# Patient Record
Sex: Female | Born: 1970 | Race: White | Hispanic: No | Marital: Married | Smoking: Never smoker
Health system: Southern US, Community
[De-identification: ages and names within clinical notes are randomized; demographics above are authoritative.]

## PROBLEM LIST (undated history)

## (undated) DIAGNOSIS — K8020 Calculus of gallbladder without cholecystitis without obstruction: Secondary | ICD-10-CM

## (undated) DIAGNOSIS — R002 Palpitations: Secondary | ICD-10-CM

## (undated) DIAGNOSIS — N83209 Unspecified ovarian cyst, unspecified side: Secondary | ICD-10-CM

## (undated) HISTORY — PX: WISDOM TOOTH EXTRACTION: SHX21

## (undated) HISTORY — DX: Calculus of gallbladder without cholecystitis without obstruction: K80.20

## (undated) HISTORY — DX: Palpitations: R00.2

## (undated) HISTORY — PX: EYE SURGERY: SHX253

---

## 2000-01-07 ENCOUNTER — Other Ambulatory Visit: Admission: RE | Admit: 2000-01-07 | Discharge: 2000-01-07 | Payer: Self-pay | Admitting: Obstetrics and Gynecology

## 2003-03-20 ENCOUNTER — Ambulatory Visit (HOSPITAL_COMMUNITY): Admission: RE | Admit: 2003-03-20 | Discharge: 2003-03-20 | Payer: Self-pay

## 2003-07-31 ENCOUNTER — Encounter (INDEPENDENT_AMBULATORY_CARE_PROVIDER_SITE_OTHER): Payer: Self-pay | Admitting: *Deleted

## 2003-07-31 ENCOUNTER — Inpatient Hospital Stay (HOSPITAL_COMMUNITY): Admission: AD | Admit: 2003-07-31 | Discharge: 2003-08-04 | Payer: Self-pay

## 2004-11-18 ENCOUNTER — Other Ambulatory Visit: Admission: RE | Admit: 2004-11-18 | Discharge: 2004-11-18 | Payer: Self-pay | Admitting: Obstetrics and Gynecology

## 2006-12-26 ENCOUNTER — Inpatient Hospital Stay (HOSPITAL_COMMUNITY): Admission: RE | Admit: 2006-12-26 | Discharge: 2006-12-29 | Payer: Self-pay | Admitting: Obstetrics and Gynecology

## 2008-12-19 HISTORY — PX: ABDOMINOPLASTY: SHX5355

## 2008-12-19 HISTORY — PX: BREAST ENHANCEMENT SURGERY: SHX7

## 2009-01-14 ENCOUNTER — Encounter: Admission: RE | Admit: 2009-01-14 | Discharge: 2009-01-14 | Payer: Self-pay | Admitting: Obstetrics and Gynecology

## 2011-05-06 NOTE — Discharge Summary (Signed)
NAME:  [name] [name], [name] [name]                          ACCOUNT NO.:  [id number]   MEDICAL RECORD NO.:  [id number]                   PATIENT TYPE:  INP   LOCATION:  9127                                 [facility]ILITY:  [facility]   PHYSICIAN:  Ronda Fairly. Galen Daft, M.D.              DATE OF BIRTH:  09/22/1971   DATE OF ADMISSION:  07/31/2003  DATE OF DISCHARGE:  08/04/2003                                 DISCHARGE SUMMARY   ADMISSION DIAGNOSIS:  HELLP syndrome, severe pre-eclampsia.   PRINCIPAL DIAGNOSIS:  HELLP syndrome, severe pre-eclampsia.   SECONDARY DIAGNOSES:  1. Term pregnancy remote from delivery.  2. Primigravida.  3. Nasal hemorrhage.   COMPLICATIONS OF HOSPITALIZATION:  None.   CONDITION AT DISCHARGE:  Improved.   PRINCIPAL PROCEDURE:  Primary low-transverse cesarean section.   SECONDARY PROCEDURE:  Magnesium prophylaxis.   FINDINGS/HOSPITAL COURSE:  The patient was admitted on the 12th with severe  pre-eclampsia and HELLP syndrome.  She had previously been well without  complaints, except for some chest pain the week before she stated.  The  patient was [redacted] weeks pregnant.  The cervix was long and closed and it was  noted that her platelets were abnormally decreased and her liver function  tests were elevated.  The patient had 3/3 reflexes.  Her blood pressure was  158/106 and otherwise she was asymptomatic.  There was proteinuria of  greater than 300 mg on the urine dip stick.  The patient had no shortness of  breath, right upper quadrant pain or visual changes.  She had no edema, no  vaginal bleeding.   The initial laboratory values showed the patient's hemoglobin to be 13.9.  Her platelets were 97,000.  Her sodium, potassium chloride and carbon  dioxide all normal.  Creatinine was 1.1 and her liver function tests showed  an elevated AST at 46 and ALT at 51.  Alkaline phosphatase was 296.  Uric  acid 9.7.  She was started on magnesium therapy immediately and the decision  for  cesarean section was based on the fact that this was an anticipated  large infant, remote to term with a closed cervix and severe pre-eclampsia  with HELLP syndrome.   The patient agreed to cesarean type delivery.  This was carried out on that  very same day under spinal anesthesia and magnesium sulfate.  It was a baby  boy, weight was 8 pounds 9 ounces and the Apgar scores were 8 and 9.  The  surgery was unremarkable.  The uterus was closed.  It was a low cervical  transverse incision and there was small erosion on the posterior wall of the  uterus.  A biopsy was performed, ultimately came back as inflammation and  adhesion otherwise unremarkable.  No definitive endometriosis.   Mom was doing well in the postoperative period.  She had normal return of  bowel function.  But she did have worsening of  her laboratory values.  She  was in the intensive care unit and maintained on magnesium during this  course.  Consideration was made for administration of steroids however, I  reviewed the literature on steroid administration for HELLP syndrome and  found that while there was statistical improvement in laboratory values,  there was no difference in outcome in the groups studied in the one  particular study.  There were approximately 30 patients in this __________  and parameters were improved __________ with the treated patients versus the  placebo patients, as far as the platelet numbers and the liver function  test, but again there was no difference in morbidity or mortality.  This was  discussed with the patient and steroids were not utilized.  The patient did  improve clinically.  She did have some nose bleeding at one point which  resolved spontaneously.  Her platelet count went from 97,000 on the day of  admission to 47,000 at it's lowest point.  Her hemoglobin dropped down to  10.2 grams and that was on the evening of the 13th.  On the 14th, the  improvement was to 11.6 and the platelet  count was 67,000.  On the 15th, it  improved further with a platelet count at 109,000.  Liver function tests  showed a similar pattern, worsening on the 13th with the highest levels on  the evening of the 13th at 102 for the AST and 59 for the ALT.  The LVH also  elevated at 870.  Uric acid at 9.0.  These values were improving on the next  day.  On the 14th, they were 67 and 47 for AST and ALT respectively.  On the  15th, the following values were AST 37, ALT 32, both of these are in normal  range and the alkaline phosphatase slightly elevated at 205.  LDH was also  improving at 415 and the uric acid had diminished substantially to 6.4.  All  of these parameters from a laboratory standpoint showed remarkable  improvement.  Her blood type was B positive.  She had a nonreactive syphilis  test.  The final hemoglobin value was 10.1 on the 15th.   The patient was discharged home with activity limits, wound instructions,  follow up in the office within one week and medication of Percocet and  vitamins on August 04, 2003.   She named her baby boy Chrissie Noa.  The baby had circumcision done without  difficulty.   The patient had normal return of bowel function.  A normalized physical  examination on the day of discharge.  Activity limits, wound care, followup  in the office and medication were all addressed.                                               Ronda Fairly. Galen Daft, M.D.    NJT/MEDQ  D:  08/27/2003  T:  08/27/2003  Job:  [id number]

## 2011-05-06 NOTE — Op Note (Signed)
NAME:  [place], Verneta                ACCOUNT NO.:  [id number]   MEDICAL RECORD NO.:  [id number]          PATIENT TYPE:  INP   LOCATION:  9124                          [facility]   PHYSICIAN:  Lenoard Aden, M.D.DATE OF BIRTH:  [birth year]   DATE OF PROCEDURE:  12/26/2006  DATE OF DISCHARGE:                               OPERATIVE REPORT   PREOPERATIVE DIAGNOSES:  1. A 37-week intrauterine pregnancy.  2. Two previous cesarean sections.  3. Presumed macrosomia.   POSTOPERATIVE DIAGNOSES:  1. A 37-week intrauterine pregnancy.  2. Two previous cesarean sections.  3. Presumed macrosomia.  4. Lower uterine segment window.   PROCEDURE:  Repeat low segment transverse cesarean section.   SURGEON:  Lenoard Aden, M.D.   ASSISTANT:  Maxie Better, M.D.   ANESTHESIA:  Spinal by Octaviano Glow. Pamalee Leyden, M.D.   ESTIMATED BLOOD LOSS:  Was 1000 mL.   COMPLICATIONS:  None.   DRAINS:  Foley catheter.   COUNTS:  Correct.   DISPOSITION:  The patient to the recovery room in good condition.  The  placenta to __________ .   FINDINGS:  Normal tubes, normal ovaries.  Large lower uterine segment  window, approximately 5 cm x 4 cm with visualization of amniotic fluid.  No dehiscence noted.  A two-layer closure.   DESCRIPTION OF PROCEDURE:  Being appraised of the risks of anesthesia,  infection, bleeding, intra-abdominal injury, need for __________  delivery and the risks and complications, to include bowel and bladder  injury.  The patient was brought to the operating room where she was  administered spinal anesthetic without complications.  She was prepped  and draped in the usual sterile fashion.  A Foley catheter was placed.  After achieving adequate anesthesia, including Marcaine solution, a  Pfannenstiel skin incision was made with the scalpel.  The fascia, which  was nicked in the midline, __________  with Mayo scissors.  The rectus  muscle was dissected sharply in the midline  and the peritoneum entered  sharply.  A bladder blade was placed.  A large lower uterine segment  window was identified and the bladder flap was dissected sharply off the  lower uterine segment.  A curved hysterotomy incision was made.  A  traumatic delivery of a 10 pound 12 ounce female with Apgars of 9 and 9.  Cord blood collected.  The placenta was delivered manually intact.  A  three-vessel cord noted.  The uterus is closed in two running  imbricating layers using #0 Monocryl suture.  Multiple interrupted  sutures placed in the midline to obtain hemostasis.  The bladder flap  was inspected and found to be hemostatic.  Irrigation accomplished.  The  fascia was closed using #0 Monocryl suture in a running fashion.  The  skin was closed with stainless steel staples.   The patient tolerated the procedure well and was transferred to the  recovery room in good condition.      Lenoard Aden, M.D.  Electronically Signed     RJT/MEDQ  D:  12/26/2006  T:  12/26/2006  Job:  [id number]

## 2011-05-06 NOTE — Discharge Summary (Signed)
NAME:  [name] [name], [name] [name]                ACCOUNT NO.:  [id number]   MEDICAL RECORD NO.:  [id number]          PATIENT TYPE:  INP   LOCATION:  9124                          [facility]   PHYSICIAN:  Lenoard Aden, M.D.DATE OF BIRTH:  01/13/1971   DATE OF ADMISSION:  12/26/2006  DATE OF DISCHARGE:  12/29/2006                               DISCHARGE SUMMARY   REASON FOR ADMISSION:  The patient underwent an uncomplicated repeat  lower segment transverse cesarean section December 26, 2006.  Postoperative course uncomplicated.  Discharge teaching done.   DISCHARGE MEDICATIONS:  1. Prenatal vitamins.  2. Iron.  3. Tylox.   FOLLOWUP:  Follow up in the office in 4-6 weeks.      Lenoard Aden, M.D.  Electronically Signed     RJT/MEDQ  D:  02/06/2007  T:  02/06/2007  Job:  [id number]

## 2011-05-06 NOTE — Op Note (Signed)
NAME:  [name] [name], [name] [name]                          ACCOUNT NO.:  [id number]   MEDICAL RECORD NO.:  [id number]                   PATIENT TYPE:  INP   LOCATION:  9372                                 [facility]   PHYSICIAN:  Ronda Fairly. Galen Daft, M.D.              DATE OF BIRTH:  26-Oct-1971   DATE OF PROCEDURE:  07/31/2003  DATE OF DISCHARGE:                                 OPERATIVE REPORT   PREOPERATIVE DIAGNOSES:  1. Preeclampsia with an unripe cervix, removed from delivery.  2. Hemolysis, elevated liver enzymes, and low platelet count.   POSTOPERATIVE DIAGNOSES:  1. Preeclampsia with an unripe cervix, removed from delivery.  2. Hemolysis, elevated liver enzymes, and low platelet count.  3. Posterior uterine wall adhesion.   PRINCIPLE PROCEDURE:  Primary low transverse cesarean section.   SECONDARY PROCEDURE:  Biopsy of posterior uterine wall adhesion.   SURGEON:  Ronda Fairly. Galen Daft, M.D.   ASSISTANT:  Rudy Jew. Ashley Royalty, M.D.   ANESTHESIA:  Spinal.   COMPLICATIONS:  None.   ESTIMATED BLOOD LOSS:  500 mL.   FINDINGS:  Female infant, Chrissie Noa.  Apgars 8 at one minute and 9 at five  minutes, weight of 8 pounds 9 ounces, vertex presentation.   OPERATIVE COURSE:  The patient was identified, time out was performed.  Intravenous antibiotics were used for prophylaxis, Pfannenstiel incision and  Betadine prep for abdominal entry.  The patient had normal draping procedure  with sterile technique.  The Pfannenstiel incision was used,  the abdomen  was entered without difficulty, the bladder flap was developed using the  bladder blade in place, and a low cervical transverse uterine incision was  carried out.  The baby was in a vertex presentation, occiput transverse.  It  was a baby boy named Chrissie Noa and the Apgars were 8 at one minute and 9 at  five minutes.  Pediatrics were in attendance.  Bulb suction was performed.  Cord was clamped and divided.  Cord pH was obtained and was within  normal  limits.  The placenta was manually extracted.  The uterus was swept free of  its containing substances.  The uterus was exteriorized for closure.  The  posterior wall of the uterus showed evidence of adhesions.  The uterus was  closed with 0 Vicryl running locking followed by an imbricating layer.  Complete hemostasis was noted at the uterine incision site.  There were no  extensions to the low cervical transverse incision.  The tubes were  unremarkable with the exception of some filmy adhesions.  In the mid  posterior wall of the uterus from the fundus down to the lower uterine  segment there was a raw appearing adhesion-like surface as if there had been  marked adherence to some other area or possibly a placenta accreta, none of  which was felt to be present on the other side of the uterus, the  endometrial side  that is.  The area had some oozing from several sites.  There was no other connection to this area also to suggest that it was  adherent to any other particular structure such as omentum or bowel.  The  appendix appeared normal.  It was visualized.  It was retrocecal but  otherwise unremarkable.  The upper liver edge did have some slight adhesions  but nothing too remarkable.  It looked as if it could be old pelvic  infection such as pelvic inflammatory disease or possibly old endometriosis.  The area were made hemostatic.  A single biopsy was performed from this area  to help qualify what was the particular etiology of the adhesions and this  was sent off to pathology.  The placenta also was sent off to pathology.  The Surgicel layer was placed over this area to keep any oozing from  occurring as prophylactic measure.  One single 2-0 chromic suture was  utilized with an SH needle in one area of oozing prior to this.  There was  complete hemostasis noted.  The uterus was placed back in the abdominal  cavity, irrigation had been performed, and antibiotics were already in   place.  The subfascial tissues were all hemostatic.  The muscle was  reapproximated at the midline with 1 Vicryl.  All sponge, instrument, and  needle counts were correct. The fascia was closed with 1 Vicryl in a running  fashion.  There was complete hemostasis and the fascia was intact and the  subcutaneous tissues were hemostatic.  All sponge, instrument, and needle  counts were correct as the subcuticular closure of 3-0 Monocryl was carried  out.  There were no complications from the procedure.  The patient tolerated  it well and the patient left the operating room in stable condition.                                               Ronda Fairly. Galen Daft, M.D.    NJT/MEDQ  D:  07/31/2003  T:  08/01/2003  Job:  [id number]   cc:   Fayrene Fearing A. Ashley Royalty, M.D.  [address]  [place], [place] [address]  Fax: [phone]

## 2016-10-13 ENCOUNTER — Other Ambulatory Visit: Payer: Self-pay | Admitting: Obstetrics and Gynecology

## 2016-10-13 DIAGNOSIS — N63 Unspecified lump in unspecified breast: Secondary | ICD-10-CM

## 2016-10-17 ENCOUNTER — Other Ambulatory Visit: Payer: Self-pay

## 2016-10-21 ENCOUNTER — Ambulatory Visit
Admission: RE | Admit: 2016-10-21 | Discharge: 2016-10-21 | Disposition: A | Discharge status: Home / Self Care | Payer: 59 | Source: Ambulatory Visit | Attending: Obstetrics and Gynecology | Admitting: Obstetrics and Gynecology

## 2016-10-21 ENCOUNTER — Other Ambulatory Visit: Payer: Self-pay | Admitting: Obstetrics and Gynecology

## 2016-10-21 DIAGNOSIS — N63 Unspecified lump in unspecified breast: Secondary | ICD-10-CM

## 2018-09-11 ENCOUNTER — Other Ambulatory Visit: Payer: Self-pay | Admitting: Obstetrics and Gynecology

## 2018-09-12 ENCOUNTER — Other Ambulatory Visit: Payer: Self-pay

## 2018-09-12 ENCOUNTER — Encounter (HOSPITAL_COMMUNITY): Payer: Self-pay | Admitting: *Deleted

## 2018-09-26 NOTE — H&P (Signed)
[name] [name] [place] is an 47 y.o. female. Menorrhagia for EAB  Pertinent Gynecological History: Menses: flow is moderate Bleeding: dysfunctional uterine bleeding Contraception: noneTL DES exposure: denies Blood transfusions: none Sexually transmitted diseases: no past history Previous GYN Procedures: na  Last mammogram: normal Date: 2019 Last pap: normal Date: 2019 OB History: G2, P2   Menstrual History: Menarche age: 40 Patient's last menstrual period was 09/03/2018 (approximate).    Past Medical History:  Diagnosis Date  . Palpitations    wore heart monitor - Normal w/PVCs, no current problems    Past Surgical History:  Procedure Laterality Date  . ABDOMINOPLASTY  2010  . BREAST ENHANCEMENT SURGERY  2010  . CESAREAN SECTION     x 2  . EYE SURGERY Bilateral    bilateral  . WISDOM TOOTH EXTRACTION      Family History  Problem Relation Age of Onset  . Kidney cancer Mother   . Prostate cancer Father     Social History:  reports that she has never smoked. She has never used smokeless tobacco. She reports that she drinks alcohol. She reports that she does not use drugs.  Allergies: No Known Allergies  No medications prior to admission.    Review of Systems  Constitutional: Negative.   All other systems reviewed and are negative.   Height 5' 6"  (1.676 m), weight 62.1 kg, last menstrual period 09/03/2018. Physical Exam  Nursing note and vitals reviewed. Constitutional: She is oriented to person, place, and time. She appears well-developed and well-nourished.  HENT:  Head: Normocephalic and atraumatic.  Neck: Normal range of motion. Neck supple.  Cardiovascular: Normal rate and regular rhythm.  Respiratory: Effort normal and breath sounds normal.  GI: Soft. Bowel sounds are normal.  Genitourinary: Vagina normal and uterus normal.  Musculoskeletal: Normal range of motion.  Neurological: She is alert and oriented to person, place, and time. She has normal reflexes.   Skin: Skin is warm and dry.  Psychiatric: She has a normal mood and affect.    No results found for this or any previous visit (from the past 24 hour(s)).  No results found.  Assessment/Plan: Menorrhagia History of TL Diag HS, D&C with EAB Surgical risks discussed. Consent done  [name] [name] 09/26/2018, 9:21 PM

## 2018-09-27 ENCOUNTER — Ambulatory Visit (HOSPITAL_COMMUNITY): Payer: 59 | Admitting: Certified Registered Nurse Anesthetist

## 2018-09-27 ENCOUNTER — Other Ambulatory Visit: Payer: Self-pay

## 2018-09-27 ENCOUNTER — Encounter (HOSPITAL_COMMUNITY)
Admission: RE | Disposition: A | Discharge status: Home / Self Care | Payer: Self-pay | Source: Ambulatory Visit | Attending: Obstetrics and Gynecology

## 2018-09-27 ENCOUNTER — Encounter (HOSPITAL_COMMUNITY): Payer: Self-pay | Admitting: Emergency Medicine

## 2018-09-27 ENCOUNTER — Ambulatory Visit (HOSPITAL_COMMUNITY)
Admission: RE | Admit: 2018-09-27 | Discharge: 2018-09-27 | Disposition: A | Discharge status: Home / Self Care | Payer: 59 | Source: Ambulatory Visit | Attending: Obstetrics and Gynecology | Admitting: Obstetrics and Gynecology

## 2018-09-27 DIAGNOSIS — Z8051 Family history of malignant neoplasm of kidney: Secondary | ICD-10-CM | POA: Insufficient documentation

## 2018-09-27 DIAGNOSIS — N840 Polyp of corpus uteri: Secondary | ICD-10-CM | POA: Diagnosis not present

## 2018-09-27 DIAGNOSIS — N920 Excessive and frequent menstruation with regular cycle: Secondary | ICD-10-CM | POA: Diagnosis present

## 2018-09-27 DIAGNOSIS — R002 Palpitations: Secondary | ICD-10-CM | POA: Insufficient documentation

## 2018-09-27 DIAGNOSIS — Z8042 Family history of malignant neoplasm of prostate: Secondary | ICD-10-CM | POA: Insufficient documentation

## 2018-09-27 DIAGNOSIS — N938 Other specified abnormal uterine and vaginal bleeding: Secondary | ICD-10-CM | POA: Insufficient documentation

## 2018-09-27 HISTORY — PX: DILITATION & CURRETTAGE/HYSTROSCOPY WITH NOVASURE ABLATION: SHX5568

## 2018-09-27 SURGERY — DILATATION & CURETTAGE/HYSTEROSCOPY WITH NOVASURE ABLATION
Anesthesia: Choice

## 2018-09-27 MED ORDER — SCOPOLAMINE 1 MG/3DAYS TD PT72
1.0000 | MEDICATED_PATCH | Freq: Once | TRANSDERMAL | Status: DC
  Administered 2018-09-27: 1.5 mg via TRANSDERMAL

## 2018-09-27 MED ORDER — MIDAZOLAM HCL 2 MG/2ML IJ SOLN
INTRAMUSCULAR | Status: AC
  Filled 2018-09-27: qty 2

## 2018-09-27 MED ORDER — ONDANSETRON HCL 4 MG/2ML IJ SOLN
INTRAMUSCULAR | Status: DC | PRN
  Administered 2018-09-27: 4 mg via INTRAVENOUS

## 2018-09-27 MED ORDER — FENTANYL CITRATE (PF) 100 MCG/2ML IJ SOLN
INTRAMUSCULAR | Status: AC
  Filled 2018-09-27: qty 2

## 2018-09-27 MED ORDER — DEXAMETHASONE SODIUM PHOSPHATE 10 MG/ML IJ SOLN
INTRAMUSCULAR | Status: DC | PRN
  Administered 2018-09-27: 4 mg via INTRAVENOUS

## 2018-09-27 MED ORDER — BUPIVACAINE HCL (PF) 0.25 % IJ SOLN
INTRAMUSCULAR | Status: AC
  Filled 2018-09-27: qty 30

## 2018-09-27 MED ORDER — OXYCODONE HCL 5 MG PO TABS
5.0000 mg | ORAL_TABLET | Freq: Once | ORAL | Status: AC | PRN
  Administered 2018-09-27: 5 mg via ORAL

## 2018-09-27 MED ORDER — KETOROLAC TROMETHAMINE 30 MG/ML IJ SOLN
INTRAMUSCULAR | Status: AC
  Filled 2018-09-27: qty 1

## 2018-09-27 MED ORDER — FENTANYL CITRATE (PF) 100 MCG/2ML IJ SOLN
INTRAMUSCULAR | Status: DC | PRN
  Administered 2018-09-27 (×2): 50 ug via INTRAVENOUS

## 2018-09-27 MED ORDER — TRAMADOL HCL 50 MG PO TABS: 50.0000 mg | ORAL_TABLET | Freq: Four times a day (QID) | ORAL | 0 refills | Status: DC | PRN

## 2018-09-27 MED ORDER — PROPOFOL 10 MG/ML IV BOLUS
INTRAVENOUS | Status: AC
  Filled 2018-09-27: qty 20

## 2018-09-27 MED ORDER — SCOPOLAMINE 1 MG/3DAYS TD PT72
MEDICATED_PATCH | TRANSDERMAL | Status: AC
  Administered 2018-09-27: 1.5 mg via TRANSDERMAL
  Filled 2018-09-27: qty 1

## 2018-09-27 MED ORDER — DEXAMETHASONE SODIUM PHOSPHATE 4 MG/ML IJ SOLN
INTRAMUSCULAR | Status: AC
  Filled 2018-09-27: qty 1

## 2018-09-27 MED ORDER — ONDANSETRON HCL 4 MG/2ML IJ SOLN: 4.0000 mg | Freq: Once | INTRAMUSCULAR | Status: DC | PRN

## 2018-09-27 MED ORDER — MIDAZOLAM HCL 2 MG/2ML IJ SOLN
INTRAMUSCULAR | Status: DC | PRN
  Administered 2018-09-27: 2 mg via INTRAVENOUS

## 2018-09-27 MED ORDER — PROPOFOL 10 MG/ML IV BOLUS
INTRAVENOUS | Status: DC | PRN
  Administered 2018-09-27: 200 mg via INTRAVENOUS
  Administered 2018-09-27: 100 mg via INTRAVENOUS

## 2018-09-27 MED ORDER — OXYCODONE HCL 5 MG PO TABS
ORAL_TABLET | ORAL | Status: AC
  Filled 2018-09-27: qty 1

## 2018-09-27 MED ORDER — LACTATED RINGERS IV SOLN
INTRAVENOUS | Status: DC
  Administered 2018-09-27 (×2): via INTRAVENOUS

## 2018-09-27 MED ORDER — SODIUM CHLORIDE 0.9 % IR SOLN
Status: DC | PRN
  Administered 2018-09-27: 3000 mL

## 2018-09-27 MED ORDER — OXYCODONE HCL 5 MG/5ML PO SOLN: 5.0000 mg | Freq: Once | ORAL | Status: AC | PRN

## 2018-09-27 MED ORDER — ONDANSETRON HCL 4 MG/2ML IJ SOLN
INTRAMUSCULAR | Status: AC
  Filled 2018-09-27: qty 2

## 2018-09-27 MED ORDER — KETOROLAC TROMETHAMINE 30 MG/ML IJ SOLN
INTRAMUSCULAR | Status: DC | PRN
  Administered 2018-09-27: 30 mg via INTRAVENOUS

## 2018-09-27 MED ORDER — BUPIVACAINE HCL (PF) 0.25 % IJ SOLN
INTRAMUSCULAR | Status: DC | PRN
  Administered 2018-09-27: 20 mL

## 2018-09-27 MED ORDER — LIDOCAINE HCL (CARDIAC) PF 100 MG/5ML IV SOSY
PREFILLED_SYRINGE | INTRAVENOUS | Status: DC | PRN
  Administered 2018-09-27: 50 mg via INTRAVENOUS

## 2018-09-27 MED ORDER — CEFAZOLIN SODIUM-DEXTROSE 2-4 GM/100ML-% IV SOLN
2.0000 g | INTRAVENOUS | Status: AC
  Administered 2018-09-27: 2 g via INTRAVENOUS

## 2018-09-27 MED ORDER — FENTANYL CITRATE (PF) 100 MCG/2ML IJ SOLN: 25.0000 ug | INTRAMUSCULAR | Status: DC | PRN

## 2018-09-27 SURGICAL SUPPLY — 14 items
ABLATOR SURESOUND NOVASURE (ABLATOR) ×2 IMPLANT
CANISTER SUCT 3000ML PPV (MISCELLANEOUS) ×2 IMPLANT
CATH ROBINSON RED A/P 16FR (CATHETERS) ×2 IMPLANT
GLOVE BIO SURGEON STRL SZ7.5 (GLOVE) ×2 IMPLANT
GLOVE BIOGEL PI IND STRL 7.0 (GLOVE) ×1 IMPLANT
GLOVE BIOGEL PI INDICATOR 7.0 (GLOVE) ×1
GOWN STRL REUS W/TWL LRG LVL3 (GOWN DISPOSABLE) ×4 IMPLANT
PACK VAGINAL MINOR WOMEN LF (CUSTOM PROCEDURE TRAY) ×2 IMPLANT
PAD OB MATERNITY 4.3X12.25 (PERSONAL CARE ITEMS) ×2 IMPLANT
PAD PREP 24X48 CUFFED NSTRL (MISCELLANEOUS) ×2 IMPLANT
SYR TB 1ML 25GX5/8 (SYRINGE) ×2 IMPLANT
TOWEL OR 17X24 6PK STRL BLUE (TOWEL DISPOSABLE) ×4 IMPLANT
TUBING AQUILEX INFLOW (TUBING) ×2 IMPLANT
TUBING AQUILEX OUTFLOW (TUBING) ×2 IMPLANT

## 2018-09-27 NOTE — Op Note (Signed)
NAME: [place], [name] [name]. MEDICAL RECORD [id number] ACCOUNT [id number] DATE OF BIRTH:11-02-1971 FACILITY: [facility] LOCATION: [facility] PHYSICIAN:Erlinda Solinger J. Ronita Hipps, MD  OPERATIVE REPORT  DATE OF PROCEDURE:  09/27/2018  PREOPERATIVE DIAGNOSIS:  Refractory menorrhagia.  POSTOPERATIVE DIAGNOSIS:  Refractory menorrhagia, endometrial polyp.  PROCEDURE:  Diagnostic hysteroscopy, dilatation and curettage, endometrial polypectomy.  SURGEON:  Brien Few, MD  ASSISTANT:  None.  ANESTHESIA:  General local.  ESTIMATED BLOOD LOSS:  mL.  FLUID DEFICIT:  462 mL  COMPLICATIONS:  None.  DRAINS:  None.  COUNTS:  Correct.  DISPOSITION:  The patient to recovery in good condition.  BRIEF OPERATIVE NOTE:  After being apprised of the risks of anesthesia, infection, bleeding, and surrounding organs, need for repair, delayed versus immediate complications including bowel and bladder, internal vessel injury, possible need for repair.   The patient brought to the operating room and administered general anesthetic without complications.  Prepped and draped in usual sterile fashion, catheterized until the bladder was empty.  Exam under anesthesia reveals an anteflexed uterus and no  adnexal masses.  Speculum placed.  Dilute paracervical block placed, 20 mL dilute Marcaine solution in standard fashion.  At this time, cervix easily dilated up to #23 [facility] dilator.  Hysteroscope placed.  Visualization reveals a posterior right lateral  wall endometrial polyp and bilateral normal tubal ostia.  No evidence of perforation.  D and C, polypectomy performed without complications.  Specimen sent for permanent pathology.  NovaSure device is entered and seated to a length of 6.5, a width of 3.6  initiated after negative CO2 test for 1 minute and 33 seconds to a power of 129 watts.  The device was removed after the procedure and inspected and found to be intact.  Revisualization of the endometrial cavity reveals a well  ablated endometrial cavity  with no evidence of uterine perforation.  Good hemostasis was noted.  Fluid deficit as noted.  The patient tolerated the procedure well.  All instruments were removed.  The patient was transferred to recovery in good condition.  TN/NUANCE  D:09/27/2018 T:09/27/2018 JOB:[id number]

## 2018-09-27 NOTE — Anesthesia Postprocedure Evaluation (Signed)
Anesthesia Post Note  Patient: [name] [name]  Procedure(s) Performed: DILATATION & CURETTAGE/HYSTEROSCOPY WITH NOVASURE ABLATION (N/A )     Patient location during evaluation: PACU Anesthesia Type: General Level of consciousness: awake and alert Pain management: pain level controlled Vital Signs Assessment: post-procedure vital signs reviewed and stable Respiratory status: spontaneous breathing, nonlabored ventilation, respiratory function stable and patient connected to nasal cannula oxygen Cardiovascular status: blood pressure returned to baseline and stable Postop Assessment: no apparent nausea or vomiting Anesthetic complications: no    Last Vitals:  Vitals:   09/27/18 1215 09/27/18 1230  BP: 116/82 123/87  Pulse: 81 79  Resp: 13 20  Temp:    SpO2: 98% 100%    Last Pain:  Vitals:   09/27/18 1215  TempSrc:   PainSc: 1    Pain Goal:                 Lidia Collum

## 2018-09-27 NOTE — Anesthesia Preprocedure Evaluation (Addendum)
Anesthesia Evaluation  Patient identified by MRN, date of birth, ID band Patient awake    Reviewed: Allergy & Precautions, NPO status , Patient's Chart, lab work & pertinent test results  History of Anesthesia Complications Negative for: history of anesthetic complications  Airway Mallampati: II  TM Distance: >3 FB Neck ROM: Full    Dental no notable dental hx.    Pulmonary neg pulmonary ROS,    Pulmonary exam normal        Cardiovascular negative cardio ROS Normal cardiovascular exam     Neuro/Psych negative neurological ROS  negative psych ROS   GI/Hepatic negative GI ROS, Neg liver ROS,   Endo/Other  negative endocrine ROS  Renal/GU negative Renal ROS  negative genitourinary   Musculoskeletal negative musculoskeletal ROS (+)   Abdominal   Peds  Hematology negative hematology ROS (+)   Anesthesia Other Findings   Reproductive/Obstetrics                             Anesthesia Physical Anesthesia Plan  ASA: II  Anesthesia Plan: General   Post-op Pain Management:    Induction: Intravenous  PONV Risk Score and Plan: Ondansetron, Dexamethasone and Treatment may vary due to age or medical condition  Airway Management Planned: LMA  Additional Equipment: None  Intra-op Plan:   Post-operative Plan: Extubation in OR  Informed Consent: I have reviewed the patients History and Physical, chart, labs and discussed the procedure including the risks, benefits and alternatives for the proposed anesthesia with the patient or authorized representative who has indicated his/her understanding and acceptance.     Plan Discussed with:   Anesthesia Plan Comments:        Anesthesia Quick Evaluation

## 2018-09-27 NOTE — Anesthesia Procedure Notes (Signed)
Procedure Name: LMA Insertion Date/Time: 09/27/2018 11:25 AM Performed by: Bufford Spikes, CRNA Pre-anesthesia Checklist: Patient identified, Emergency Drugs available, Suction available and Patient being monitored Patient Re-evaluated:Patient Re-evaluated prior to induction Oxygen Delivery Method: Circle system utilized Preoxygenation: Pre-oxygenation with 100% oxygen Induction Type: IV induction Ventilation: Mask ventilation without difficulty LMA: LMA inserted LMA Size: 4.0 Number of attempts: 1 Airway Equipment and Method: Bite block Placement Confirmation: positive ETCO2 Tube secured with: Tape Dental Injury: Teeth and Oropharynx as per pre-operative assessment

## 2018-09-27 NOTE — Transfer of Care (Signed)
Immediate Anesthesia Transfer of Care Note  Patient: [name] [name]  Procedure(s) Performed: DILATATION & CURETTAGE/HYSTEROSCOPY WITH NOVASURE ABLATION (N/A )  Patient Location: PACU  Anesthesia Type:General  Level of Consciousness: awake, alert  and oriented  Airway & Oxygen Therapy: Patient Spontanous Breathing and Patient connected to nasal cannula oxygen  Post-op Assessment: Report given to RN and Post -op Vital signs reviewed and stable  Post vital signs: Reviewed and stable  Last Vitals:  Vitals Value Taken Time  BP    Temp    Pulse 95 09/27/2018 12:02 PM  Resp 14 09/27/2018 12:03 PM  SpO2 100 % 09/27/2018 12:02 PM  Vitals shown include unvalidated device data.  Last Pain:  Vitals:   09/27/18 1113  TempSrc: Oral         Complications: No apparent anesthesia complications

## 2018-09-27 NOTE — Op Note (Signed)
09/27/2018  11:48 AM  PATIENT:  [name] [name]  47 y.o. female  PRE-OPERATIVE DIAGNOSIS:  Menorrhagia  POST-OPERATIVE DIAGNOSIS:  Menorrhagia Endometrial polyp  PROCEDURE:  Procedure(s): DILATATION & CURETTAGE DIAGNOSTIC HYSTEROSCOPY WITH NOVASURE ABLATION ENDOMETRIAL POLYPECTOMY  SURGEON:  Surgeon(s): Brien Few, MD  ASSISTANTS: none   ANESTHESIA:   local and general  ESTIMATED BLOOD LOSS: 5 mL   DRAINS: none   LOCAL MEDICATIONS USED:  MARCAINE    and Amount: 20 ml  SPECIMEN:  Source of Specimen:  EMC AND POLYP  DISPOSITION OF SPECIMEN:  PATHOLOGY  COUNTS:  YES  DICTATION #: [id number]  PLAN OF CARE: DC HOME  PATIENT DISPOSITION:  PACU - hemodynamically stable.

## 2018-09-27 NOTE — Discharge Instructions (Signed)
DISCHARGE INSTRUCTIONS: HYSTEROSCOPY / ENDOMETRIAL ABLATION The following instructions have been prepared to help you care for yourself upon your return home.  May Remove Scop patch on or before Sunday.  May take Ibuprofen after 5:30 PM.  May take stool softner while taking narcotic pain medication to prevent constipation.  Drink plenty of water.  Personal hygiene:  Use sanitary pads for vaginal drainage, not tampons.  Shower the day after your procedure.  NO tub baths, pools or Jacuzzis for 2-3 weeks.  Wipe front to back after using the bathroom.  Activity and limitations:  Do NOT drive or operate any equipment for 24 hours. The effects of anesthesia are still present and drowsiness may result.  Do NOT rest in bed all day.  Walking is encouraged.  Walk up and down stairs slowly.  You may resume your normal activity in one to two days or as indicated by your physician. Sexual activity: NO intercourse for at least 2 weeks after the procedure, or as indicated by your Doctor.  Diet: Eat a light meal as desired this evening. You may resume your usual diet tomorrow.  Return to Work: You may resume your work activities in one to two days or as indicated by Marine scientist.  What to expect after your surgery: Expect to have vaginal bleeding/discharge for 2-3 days and spotting for up to 10 days. It is not unusual to have soreness for up to 1-2 weeks. You may have a slight burning sensation when you urinate for the first day. Mild cramps may continue for a couple of days. You may have a regular period in 2-6 weeks.  Call your doctor for any of the following:  Excessive vaginal bleeding or clotting, saturating and changing one pad every hour.  Inability to urinate 6 hours after discharge from hospital.  Pain not relieved by pain medication.  Fever of 100.4 F or greater.  Unusual vaginal discharge or odor.   Post Anesthesia Home Care Instructions  Activity: Get plenty of  rest for the remainder of the day. A responsible individual must stay with you for 24 hours following the procedure.  For the next 24 hours, DO NOT: -Drive a car -Paediatric nurse -Drink alcoholic beverages -Take any medication unless instructed by your physician -Make any legal decisions or sign important papers.  Meals: Start with liquid foods such as gelatin or soup. Progress to regular foods as tolerated. Avoid greasy, spicy, heavy foods. If nausea and/or vomiting occur, drink only clear liquids until the nausea and/or vomiting subsides. Call your physician if vomiting continues.  Special Instructions/Symptoms: Your throat may feel dry or sore from the anesthesia or the breathing tube placed in your throat during surgery. If this causes discomfort, gargle with warm salt water. The discomfort should disappear within 24 hours.  If you had a scopolamine patch placed behind your ear for the management of post- operative nausea and/or vomiting:  1. The medication in the patch is effective for 72 hours, after which it should be removed.  Wrap patch in a tissue and discard in the trash. Wash hands thoroughly with soap and water. 2. You may remove the patch earlier than 72 hours if you experience unpleasant side effects which may include dry mouth, dizziness or visual disturbances. 3. Avoid touching the patch. Wash your hands with soap and water after contact with the patch.

## 2018-09-28 ENCOUNTER — Encounter (HOSPITAL_COMMUNITY): Payer: Self-pay | Admitting: Obstetrics and Gynecology

## 2019-06-06 ENCOUNTER — Emergency Department (HOSPITAL_BASED_OUTPATIENT_CLINIC_OR_DEPARTMENT_OTHER): Payer: 59

## 2019-06-06 ENCOUNTER — Encounter (HOSPITAL_BASED_OUTPATIENT_CLINIC_OR_DEPARTMENT_OTHER): Payer: Self-pay

## 2019-06-06 ENCOUNTER — Inpatient Hospital Stay (HOSPITAL_COMMUNITY): Payer: 59

## 2019-06-06 ENCOUNTER — Other Ambulatory Visit: Payer: Self-pay

## 2019-06-06 ENCOUNTER — Inpatient Hospital Stay (HOSPITAL_BASED_OUTPATIENT_CLINIC_OR_DEPARTMENT_OTHER)
Admission: EM | Admit: 2019-06-06 | Discharge: 2019-06-09 | DRG: 871 | Disposition: A | Discharge status: Home / Self Care | Payer: 59 | Attending: Internal Medicine | Admitting: Internal Medicine

## 2019-06-06 DIAGNOSIS — R002 Palpitations: Secondary | ICD-10-CM | POA: Diagnosis present

## 2019-06-06 DIAGNOSIS — Z20828 Contact with and (suspected) exposure to other viral communicable diseases: Secondary | ICD-10-CM | POA: Diagnosis present

## 2019-06-06 DIAGNOSIS — K651 Peritoneal abscess: Secondary | ICD-10-CM | POA: Diagnosis not present

## 2019-06-06 DIAGNOSIS — R000 Tachycardia, unspecified: Secondary | ICD-10-CM | POA: Diagnosis present

## 2019-06-06 DIAGNOSIS — A419 Sepsis, unspecified organism: Principal | ICD-10-CM

## 2019-06-06 DIAGNOSIS — R17 Unspecified jaundice: Secondary | ICD-10-CM

## 2019-06-06 DIAGNOSIS — Z8042 Family history of malignant neoplasm of prostate: Secondary | ICD-10-CM | POA: Diagnosis not present

## 2019-06-06 DIAGNOSIS — N730 Acute parametritis and pelvic cellulitis: Secondary | ICD-10-CM

## 2019-06-06 DIAGNOSIS — N7093 Salpingitis and oophoritis, unspecified: Secondary | ICD-10-CM | POA: Diagnosis present

## 2019-06-06 DIAGNOSIS — N83292 Other ovarian cyst, left side: Secondary | ICD-10-CM | POA: Diagnosis present

## 2019-06-06 DIAGNOSIS — N83291 Other ovarian cyst, right side: Secondary | ICD-10-CM | POA: Diagnosis present

## 2019-06-06 DIAGNOSIS — N390 Urinary tract infection, site not specified: Secondary | ICD-10-CM

## 2019-06-06 DIAGNOSIS — N739 Female pelvic inflammatory disease, unspecified: Secondary | ICD-10-CM

## 2019-06-06 DIAGNOSIS — K8020 Calculus of gallbladder without cholecystitis without obstruction: Secondary | ICD-10-CM | POA: Diagnosis present

## 2019-06-06 DIAGNOSIS — Z8051 Family history of malignant neoplasm of kidney: Secondary | ICD-10-CM | POA: Diagnosis not present

## 2019-06-06 HISTORY — DX: Unspecified ovarian cyst, unspecified side: N83.209

## 2019-06-06 HISTORY — DX: Gilbert syndrome: E80.4

## 2019-06-06 LAB — URINALYSIS, ROUTINE W REFLEX MICROSCOPIC
Bilirubin Urine: NEGATIVE
Glucose, UA: NEGATIVE mg/dL
Hgb urine dipstick: NEGATIVE
Ketones, ur: NEGATIVE mg/dL
Leukocytes,Ua: NEGATIVE
Nitrite: POSITIVE — AB
Protein, ur: NEGATIVE mg/dL
Specific Gravity, Urine: 1.015 (ref 1.005–1.030)
pH: 8 (ref 5.0–8.0)

## 2019-06-06 LAB — CBC WITH DIFFERENTIAL/PLATELET
Abs Immature Granulocytes: 0.05 10*3/uL (ref 0.00–0.07)
Basophils Absolute: 0 10*3/uL (ref 0.0–0.1)
Basophils Relative: 0 %
Eosinophils Absolute: 0 10*3/uL (ref 0.0–0.5)
Eosinophils Relative: 0 %
HCT: 42.9 % (ref 36.0–46.0)
Hemoglobin: 14.3 g/dL (ref 12.0–15.0)
Immature Granulocytes: 0 %
Lymphocytes Relative: 7 %
Lymphs Abs: 1.1 10*3/uL (ref 0.7–4.0)
MCH: 29.1 pg (ref 26.0–34.0)
MCHC: 33.3 g/dL (ref 30.0–36.0)
MCV: 87.2 fL (ref 80.0–100.0)
Monocytes Absolute: 0.6 10*3/uL (ref 0.1–1.0)
Monocytes Relative: 4 %
Neutro Abs: 12.6 10*3/uL — ABNORMAL HIGH (ref 1.7–7.7)
Neutrophils Relative %: 89 %
Platelets: 190 10*3/uL (ref 150–400)
RBC: 4.92 MIL/uL (ref 3.87–5.11)
RDW: 11.7 % (ref 11.5–15.5)
WBC: 14.3 10*3/uL — ABNORMAL HIGH (ref 4.0–10.5)
nRBC: 0 % (ref 0.0–0.2)

## 2019-06-06 LAB — WET PREP, GENITAL
Clue Cells Wet Prep HPF POC: NONE SEEN
Sperm: NONE SEEN
Trich, Wet Prep: NONE SEEN
Yeast Wet Prep HPF POC: NONE SEEN

## 2019-06-06 LAB — COMPREHENSIVE METABOLIC PANEL
ALT: 14 U/L (ref 0–44)
AST: 17 U/L (ref 15–41)
Albumin: 4.3 g/dL (ref 3.5–5.0)
Alkaline Phosphatase: 51 U/L (ref 38–126)
Anion gap: 10 (ref 5–15)
BUN: 11 mg/dL (ref 6–20)
CO2: 27 mmol/L (ref 22–32)
Calcium: 9.2 mg/dL (ref 8.9–10.3)
Chloride: 99 mmol/L (ref 98–111)
Creatinine, Ser: 0.69 mg/dL (ref 0.44–1.00)
GFR calc Af Amer: >60 mL/min (ref >60)
GFR calc non Af Amer: >60 mL/min (ref >60)
Glucose, Bld: 123 mg/dL — ABNORMAL HIGH (ref 70–99)
Potassium: 3.9 mmol/L (ref 3.5–5.1)
Sodium: 136 mmol/L (ref 135–145)
Total Bilirubin: 2.8 mg/dL — ABNORMAL HIGH (ref 0.3–1.2)
Total Protein: 7.6 g/dL (ref 6.5–8.1)

## 2019-06-06 LAB — PREGNANCY, URINE: Preg Test, Ur: NEGATIVE

## 2019-06-06 LAB — URINALYSIS, MICROSCOPIC (REFLEX)

## 2019-06-06 LAB — LACTIC ACID, PLASMA: Lactic Acid, Venous: 0.9 mmol/L (ref 0.5–1.9)

## 2019-06-06 LAB — SARS CORONAVIRUS 2 AG (30 MIN TAT): SARS Coronavirus 2 Ag: NEGATIVE

## 2019-06-06 LAB — LIPASE, BLOOD: Lipase: 33 U/L (ref 11–51)

## 2019-06-06 MED ORDER — SODIUM CHLORIDE 0.9 % IV SOLN
INTRAVENOUS | Status: DC | PRN
  Administered 2019-06-06: 250 mL via INTRAVENOUS

## 2019-06-06 MED ORDER — SODIUM CHLORIDE 0.9 % IV BOLUS
1000.0000 mL | Freq: Once | INTRAVENOUS | Status: AC
  Administered 2019-06-06: 1000 mL via INTRAVENOUS

## 2019-06-06 MED ORDER — ONDANSETRON HCL 4 MG/2ML IJ SOLN
4.0000 mg | Freq: Once | INTRAMUSCULAR | Status: AC
  Administered 2019-06-06: 4 mg via INTRAVENOUS
  Filled 2019-06-06: qty 2

## 2019-06-06 MED ORDER — PIPERACILLIN-TAZOBACTAM 3.375 G IVPB
3.3750 g | Freq: Three times a day (TID) | INTRAVENOUS | Status: DC
  Administered 2019-06-06 – 2019-06-09 (×8): 3.375 g via INTRAVENOUS
  Filled 2019-06-06 (×8): qty 50

## 2019-06-06 MED ORDER — ACETAMINOPHEN 325 MG PO TABS: 650.0000 mg | ORAL_TABLET | Freq: Four times a day (QID) | ORAL | Status: DC | PRN

## 2019-06-06 MED ORDER — ACETAMINOPHEN 650 MG RE SUPP: 650.0000 mg | Freq: Four times a day (QID) | RECTAL | Status: DC | PRN

## 2019-06-06 MED ORDER — FENTANYL CITRATE (PF) 100 MCG/2ML IJ SOLN
50.0000 ug | Freq: Once | INTRAMUSCULAR | Status: AC
  Administered 2019-06-06: 50 ug via INTRAVENOUS
  Filled 2019-06-06: qty 2

## 2019-06-06 MED ORDER — SODIUM CHLORIDE 0.9 % IV BOLUS: 1000.0000 mL | Freq: Once | INTRAVENOUS | Status: DC

## 2019-06-06 MED ORDER — SODIUM CHLORIDE 0.9 % IV SOLN
100.0000 mg | Freq: Two times a day (BID) | INTRAVENOUS | Status: DC
  Administered 2019-06-07 – 2019-06-09 (×6): 100 mg via INTRAVENOUS
  Filled 2019-06-06 (×7): qty 100

## 2019-06-06 MED ORDER — SODIUM CHLORIDE 0.9 % IV SOLN
1.0000 g | Freq: Once | INTRAVENOUS | Status: AC
  Administered 2019-06-06: 1 g via INTRAVENOUS
  Filled 2019-06-06: qty 10

## 2019-06-06 MED ORDER — LACTATED RINGERS IV SOLN
INTRAVENOUS | Status: DC
  Administered 2019-06-06: 19:00:00 via INTRAVENOUS

## 2019-06-06 MED ORDER — MORPHINE SULFATE (PF) 2 MG/ML IV SOLN: 1.0000 mg | INTRAVENOUS | Status: DC | PRN

## 2019-06-06 MED ORDER — SODIUM CHLORIDE 0.9 % IV SOLN
INTRAVENOUS | Status: AC
  Administered 2019-06-06: 23:00:00 via INTRAVENOUS

## 2019-06-06 MED ORDER — MORPHINE SULFATE (PF) 4 MG/ML IV SOLN
4.0000 mg | Freq: Once | INTRAVENOUS | Status: AC
  Administered 2019-06-06: 13:00:00 4 mg via INTRAVENOUS
  Filled 2019-06-06: qty 1

## 2019-06-06 MED ORDER — IOHEXOL 300 MG/ML  SOLN
100.0000 mL | Freq: Once | INTRAMUSCULAR | Status: AC | PRN
  Administered 2019-06-06: 100 mL via INTRAVENOUS

## 2019-06-06 MED ORDER — PIPERACILLIN-TAZOBACTAM 3.375 G IVPB 30 MIN
3.3750 g | Freq: Once | INTRAVENOUS | Status: AC
  Administered 2019-06-06: 18:00:00 3.375 g via INTRAVENOUS
  Filled 2019-06-06 (×2): qty 50

## 2019-06-06 NOTE — ED Notes (Signed)
Spoke to [place] at [facility] for consult to hospitalist at [facility]

## 2019-06-06 NOTE — ED Provider Notes (Signed)
[place] EMERGENCY DEPARTMENT Provider Note   CSN: [id number] Arrival date & time: 06/06/19  1122     History   Chief Complaint Chief Complaint  Patient presents with  . Abdominal Pain    HPI [name] [name] is a 48 y.o. female.     The history is provided by the patient. No language interpreter was used.  Abdominal Pain  [name] [name] is a 48 y.o. female who presents to the Emergency Department complaining of abdominal pain. [name] [name] presents to the emergency department complaining of severe generalized abdominal pain that began last night. Pain is described as constant in nature and worse with movement. [name] [name] had associated nausea and vomiting. [name] [name] also experienced multiple bowel movements but denies diarrhea. No reports of fevers but [name] [name] has been hot at times. No known bad food exposures. [name] [name] denies any shortness of breath but does feel very fatigued. 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 Denies tobacco, occasional alcohol. No known sick contacts. No prior similar symptoms. [name] [name] has a history of C-section, endometrial ablation, abdominoplasty. Past Medical History:  Diagnosis Date  . Gilbert syndrome   . Ovarian cyst   . Palpitations    wore heart monitor - Normal w/PVCs, no current problems    There are no active problems to display for this patient.   Past Surgical History:  Procedure Laterality Date  . ABDOMINOPLASTY  2010  . BREAST ENHANCEMENT SURGERY  2010  . CESAREAN SECTION     x 2  . DILITATION & CURRETTAGE/HYSTROSCOPY WITH NOVASURE ABLATION N/A 09/27/2018   Procedure: DILATATION & CURETTAGE/HYSTEROSCOPY WITH NOVASURE ABLATION;  Surgeon: Brien Few, MD;  Location: [facility];  Service: Gynecology;  Laterality: N/A;  . EYE SURGERY Bilateral    bilateral  . WISDOM TOOTH EXTRACTION       OB History   No obstetric history on file.      Home Medications    Prior to Admission medications   Medication Sig Start Date End Date  Taking? Authorizing Provider  traMADol (ULTRAM) 50 MG tablet Take 1-2 tablets (50-100 mg total) by mouth every 6 (six) hours as needed. 09/27/18   Brien Few, MD    Family History Family History  Problem Relation Age of Onset  . Kidney cancer Mother   . Prostate cancer Father     Social History Social History   Tobacco Use  . Smoking status: Never Smoker  . Smokeless tobacco: Never Used  Substance Use Topics  . Alcohol use: Yes    Comment: occasional  . Drug use: Never     Allergies   Patient has no known allergies.   Review of Systems Review of Systems  Gastrointestinal: Positive for abdominal pain.  All other systems reviewed and are negative.    Physical Exam Updated Vital Signs BP 119/81 (BP Location: Left Arm)   Pulse (!) 109   Temp 99.4 F (37.4 C) (Oral)   Resp 18   Ht 5' 6"  (1.676 m)   Wt 62.1 kg   SpO2 100%   BMI 22.11 kg/m   Physical Exam Vitals signs and nursing note reviewed.  Constitutional:      Appearance: [name] [name] is well-developed.  HENT:     Head: Normocephalic and atraumatic.  Cardiovascular:     Rate and Rhythm: Normal rate and regular rhythm.  Pulmonary:     Effort: Pulmonary effort is normal. No respiratory distress.  Abdominal:     Tenderness: There is no  guarding or rebound.     Comments: Moderate generalized abdominal tenderness. Mild  distention.  Musculoskeletal:        General: No swelling or tenderness.  Skin:    General: Skin is warm and dry.  Neurological:     Mental Status: [name] [name] is alert and oriented to person, place, and time.  Psychiatric:        Behavior: Behavior normal.      ED Treatments / Results  Labs (all labs ordered are listed, but only abnormal results are displayed) Labs Reviewed  COMPREHENSIVE METABOLIC PANEL  LIPASE, BLOOD  CBC WITH DIFFERENTIAL/PLATELET  URINALYSIS, ROUTINE W REFLEX MICROSCOPIC  PREGNANCY, URINE    EKG    Radiology No results found.  Procedures Procedures  (including critical care time)  Medications Ordered in ED Medications - No data to display   Initial Impression / Assessment and Plan / ED Course  I have reviewed the triage vital signs and the nursing notes.  Pertinent labs & imaging results that were available during my care of the patient were reviewed by me and considered in my medical decision making (see chart for details).        Patient here for evaluation of abdominal pain, vomiting since yesterday. [name] [name] does have significant tenderness on examination. Labs are significant for leukocytosis. UA is concerning for UTI and [name] [name] was treated with antibiotics. Patient care transferred pending CT abdomen pelvis.  Final Clinical Impressions(s) / ED Diagnoses   Final diagnoses:  None    ED Discharge Orders    None       Quintella Reichert, MD 06/06/19 1528

## 2019-06-06 NOTE — ED Notes (Signed)
Report to Garnett [place], RN at [facility] 4E.

## 2019-06-06 NOTE — Progress Notes (Signed)
Matt R.N. spoke with Dr. Marlowe Sax about repeat Cov.-19 test. [facility] R.N. also spoke with patient about why need another one. Patient wanted to talk with Dr. Marlowe Sax about it. [facility] text page M.D. Dr. Marlowe Sax was made aware and unable to speak with patient at this time. Patient has refused repeat test.

## 2019-06-06 NOTE — Consult Note (Signed)
Reason for Consult: Abdominal pain Referring Physician: Lonny Prude MD Novalyn H [place] is an 48 y.o. female.  HPI: Patient transferred from the medical center of [facility] emergency room with a sudden onset of abdominal pain.  This started yesterday about 25 hours ago.  She describes the sudden onset of sharp lower abdominal pain.  She had one episode of vomiting but none since.  She denies any diarrhea or constipation.  She has never had a colonoscopy.  She was seen in the emergency room at [facility] and CT showed findings consistent with pelvic inflammatory disease.  She underwent a pelvic ultrasound which showed similar results and fluid collections posterior to the uterus.  The radiology report he could not see the appendix.  Surgery was consulted for this.  Patient still has lower abdominal pain which is a 7 8 out of 10.  Its more constant made worse with movement.  She has been tachycardic without fever.   Past Medical History:  Diagnosis Date  . Gilbert syndrome   . Ovarian cyst   . Palpitations    wore heart monitor - Normal w/PVCs, no current problems    Past Surgical History:  Procedure Laterality Date  . ABDOMINOPLASTY  2010  . BREAST ENHANCEMENT SURGERY  2010  . CESAREAN SECTION     x 2  . DILITATION & CURRETTAGE/HYSTROSCOPY WITH NOVASURE ABLATION N/A 09/27/2018   Procedure: DILATATION & CURETTAGE/HYSTEROSCOPY WITH NOVASURE ABLATION;  Surgeon: Brien Few, MD;  Location: [facility];  Service: Gynecology;  Laterality: N/A;  . EYE SURGERY Bilateral    bilateral  . WISDOM TOOTH EXTRACTION      Family History  Problem Relation Age of Onset  . Kidney cancer Mother   . Prostate cancer Father     Social History:  reports that she has never smoked. She has never used smokeless tobacco. She reports current alcohol use. She reports that she does not use drugs.  Allergies: No Known Allergies  Medications: I have reviewed the patient's current  medications.  Results for orders placed or performed during the hospital encounter of 06/06/19 (from the past 48 hour(s))  Comprehensive metabolic panel     Status: Abnormal   Collection Time: 06/06/19  1:01 PM  Result Value Ref Range   Sodium 136 135 - 145 mmol/L   Potassium 3.9 3.5 - 5.1 mmol/L   Chloride 99 98 - 111 mmol/L   CO2 27 22 - 32 mmol/L   Glucose, Bld 123 (H) 70 - 99 mg/dL   BUN 11 6 - 20 mg/dL   Creatinine, Ser 0.69 0.44 - 1.00 mg/dL   Calcium 9.2 8.9 - 10.3 mg/dL   Total Protein 7.6 6.5 - 8.1 g/dL   Albumin 4.3 3.5 - 5.0 g/dL   AST 17 15 - 41 U/L   ALT 14 0 - 44 U/L   Alkaline Phosphatase 51 38 - 126 U/L   Total Bilirubin 2.8 (H) 0.3 - 1.2 mg/dL   GFR calc non Af Amer >60 >60 mL/min   GFR calc Af Amer >60 >60 mL/min   Anion gap 10 5 - 15    Comment: Performed at [facility], [address] [place], [place] [address]  Lipase, blood     Status: None   Collection Time: 06/06/19  1:01 PM  Result Value Ref Range   Lipase 33 11 - 51 U/L    Comment: Performed at [facility], [address]  [place], [place] [address]  CBC with Differential     Status: Abnormal   Collection Time: 06/06/19  1:01 PM  Result Value Ref Range   WBC 14.3 (H) 4.0 - 10.5 K/uL   RBC 4.92 3.87 - 5.11 MIL/uL   Hemoglobin 14.3 12.0 - 15.0 g/dL   HCT 42.9 36.0 - 46.0 %   MCV 87.2 80.0 - 100.0 fL   MCH 29.1 26.0 - 34.0 pg   MCHC 33.3 30.0 - 36.0 g/dL   RDW 11.7 11.5 - 15.5 %   Platelets 190 150 - 400 K/uL   nRBC 0.0 0.0 - 0.2 %   Neutrophils Relative % 89 %   Neutro Abs 12.6 (H) 1.7 - 7.7 K/uL   Lymphocytes Relative 7 %   Lymphs Abs 1.1 0.7 - 4.0 K/uL   Monocytes Relative 4 %   Monocytes Absolute 0.6 0.1 - 1.0 K/uL   Eosinophils Relative 0 %   Eosinophils Absolute 0.0 0.0 - 0.5 K/uL   Basophils Relative 0 %   Basophils Absolute 0.0 0.0 - 0.1 K/uL   Immature Granulocytes 0 %   Abs Immature Granulocytes 0.05 0.00 - 0.07 K/uL    Comment: Performed at [facility], [address] [place], [place] [address]  Urinalysis, Routine w reflex microscopic     Status: Abnormal   Collection Time: 06/06/19  1:01 PM  Result Value Ref Range   Color, Urine YELLOW YELLOW   APPearance CLOUDY (A) CLEAR   Specific Gravity, Urine 1.015 1.005 - 1.030   pH 8.0 5.0 - 8.0   Glucose, UA NEGATIVE NEGATIVE mg/dL   Hgb urine dipstick NEGATIVE NEGATIVE   Bilirubin Urine NEGATIVE NEGATIVE   Ketones, ur NEGATIVE NEGATIVE mg/dL   Protein, ur NEGATIVE NEGATIVE mg/dL   Nitrite POSITIVE (A) NEGATIVE   Leukocytes,Ua NEGATIVE NEGATIVE    Comment: Performed at [facility], [address] [place], [place] [address]  Pregnancy, urine     Status: None   Collection Time: 06/06/19  1:01 PM  Result Value Ref Range   Preg Test, Ur NEGATIVE NEGATIVE    Comment:        THE SENSITIVITY OF THIS METHODOLOGY IS >20 mIU/mL. Performed at [facility], [address] [place], [place] [address]   Urinalysis, Microscopic (reflex)     Status: Abnormal   Collection Time: 06/06/19  1:01 PM  Result Value Ref Range   RBC / HPF 0-5 0 - 5 RBC/hpf   WBC, UA 11-20 0 - 5 WBC/hpf   Bacteria, UA MANY (A) NONE SEEN   Squamous Epithelial / LPF 0-5 0 - 5    Comment: Performed at [facility], [address] [place], [place] [address]  Wet prep, genital     Status: Abnormal   Collection Time: 06/06/19  4:06 PM   Specimen: Cervical/Vaginal swab  Result Value Ref Range   Yeast Wet Prep HPF POC NONE SEEN NONE SEEN   Trich, Wet Prep NONE SEEN NONE SEEN   Clue Cells Wet Prep HPF POC NONE SEEN NONE SEEN   WBC, Wet Prep HPF POC MANY (A) NONE SEEN   Sperm NONE SEEN     Comment: Performed at [facility], [address] [place], [place] [address]  SARS Coronavirus 2 (Hosp order,Performed in [place] lab via [employer] ID)     Status: None   Collection Time: 06/06/19  5:31 PM  Specimen: Dry Nasal Swab ([employer] ID Now)  Result Value Ref Range   SARS  Coronavirus 2 ([employer] ID Now) NEGATIVE NEGATIVE    Comment: (NOTE) Interpretive Result Comment(s): COVID 19 Positive SARS CoV 2 target nucleic acids are DETECTED. The SARS CoV 2 RNA is generally detectable in upper and lower respiratory specimens during the acute phase of infection.  Positive results are indicative of active infection with SARS CoV 2.  Clinical correlation with patient history and other diagnostic information is necessary to determine patient infection status.  Positive results do not rule out bacterial infection or coinfection with other viruses. The expected result is Negative. COVID 19 Negative SARS CoV 2 target nucleic acids are NOT DETECTED. The SARS CoV 2 RNA is generally detectable in upper and lower respiratory specimens during the acute phase of infection.  Negative results do not preclude SARS CoV 2 infection, do not rule out coinfections with other pathogens, and should not be used as the sole basis for treatment or other patient management decisions.  Negative results must be combined with clinical  observations, patient history, and epidemiological information. The expected result is Negative. Invalid Presence or absence of SARS CoV 2 nucleic acids cannot be determined. Repeat testing was performed on the submitted specimen and repeated Invalid results were obtained.  If clinically indicated, additional testing on a new specimen with an alternate test methodology [phone]) is advised.  The SARS CoV 2 RNA is generally detectable in upper and lower respiratory specimens during the acute phase of infection. The expected result is Negative. Fact Sheet for Patients:  GolfingFamily.no Fact Sheet for Healthcare Providers: https://www.hernandez-brewer.com/ This test is not yet approved or cleared by the [place] FDA and has been authorized for detection and/or diagnosis of SARS CoV 2 by FDA under an Emergency Use  Authorization (EUA).  This EUA will remain in effect (meaning this test can be used) for the duration of the COVID19 d eclaration under Section 564(b)(1) of the Act, 21 U.S.C. section [phone] 3(b)(1), unless the authorization is terminated or revoked sooner. Performed at [facility], [address] [place], [place] [address]   Lactic acid, plasma     Status: None   Collection Time: 06/06/19  6:27 PM  Result Value Ref Range   Lactic Acid, Venous 0.9 0.5 - 1.9 mmol/L    Comment: Performed at [facility], [address] [place], [place] [address]    US Transvaginal Non-ob  Result Date: 06/06/2019 CLINICAL DATA:  Sudden onset of abdominal pain. EXAM: TRANSABDOMINAL AND TRANSVAGINAL ULTRASOUND OF PELVIS DOPPLER ULTRASOUND OF OVARIES TECHNIQUE: Both transabdominal and transvaginal ultrasound examinations of the pelvis were performed. Transabdominal technique was performed for global imaging of the pelvis including uterus, ovaries, adnexal regions, and pelvic cul-de-sac. It was necessary to proceed with endovaginal exam following the transabdominal exam to visualize the ovaries. Color and duplex Doppler ultrasound was utilized to evaluate blood flow to the ovaries. COMPARISON:  None. FINDINGS: Uterus Measurements: 7.5 x 3.9 x 4.2 cm = volume: 63.7 mL. The uterus is heterogeneous. There is evidence of a prior Caesarean section scar which limits evaluation. Endometrium Thickness: 8.  No focal abnormality visualized. Right ovary Measurements: 5.5 x 3.6 x 3.8 cm = volume: 39 mL. There is a complex 3.4 x 2.2 x 3.2 cm cystic structure involving the right ovary. This structure contains a thin internal septation. In the region of the right adnexa there is a tubular structure with filled with complex  fluid. Left ovary Measurements: 6.3 x 4.2 x 5.8 cm = volume: 82 mL. There is a 5 x 3.5 x 5.1 cm cystic structure with low level internal echoes. There appears to be an adjacent tubular  structure with complex fluid. Pulsed Doppler evaluation of both ovaries demonstrates normal low-resistance arterial and venous waveforms. Other findings There is a small amount of free fluid in the pelvis. IMPRESSION: 1. No sonographic evidence for ovarian torsion. 2. Findings consistent with bilateral tubo-ovarian abscesses. 3. Small amount of free fluid in the pelvis. Electronically Signed   By: Constance Holster M.D.   On: 06/06/2019 19:57   US Pelvis Complete  Result Date: 06/06/2019 CLINICAL DATA:  Sudden onset of abdominal pain. EXAM: TRANSABDOMINAL AND TRANSVAGINAL ULTRASOUND OF PELVIS DOPPLER ULTRASOUND OF OVARIES TECHNIQUE: Both transabdominal and transvaginal ultrasound examinations of the pelvis were performed. Transabdominal technique was performed for global imaging of the pelvis including uterus, ovaries, adnexal regions, and pelvic cul-de-sac. It was necessary to proceed with endovaginal exam following the transabdominal exam to visualize the ovaries. Color and duplex Doppler ultrasound was utilized to evaluate blood flow to the ovaries. COMPARISON:  None. FINDINGS: Uterus Measurements: 7.5 x 3.9 x 4.2 cm = volume: 63.7 mL. The uterus is heterogeneous. There is evidence of a prior Caesarean section scar which limits evaluation. Endometrium Thickness: 8.  No focal abnormality visualized. Right ovary Measurements: 5.5 x 3.6 x 3.8 cm = volume: 39 mL. There is a complex 3.4 x 2.2 x 3.2 cm cystic structure involving the right ovary. This structure contains a thin internal septation. In the region of the right adnexa there is a tubular structure with filled with complex fluid. Left ovary Measurements: 6.3 x 4.2 x 5.8 cm = volume: 82 mL. There is a 5 x 3.5 x 5.1 cm cystic structure with low level internal echoes. There appears to be an adjacent tubular structure with complex fluid. Pulsed Doppler evaluation of both ovaries demonstrates normal low-resistance arterial and venous waveforms. Other findings  There is a small amount of free fluid in the pelvis. IMPRESSION: 1. No sonographic evidence for ovarian torsion. 2. Findings consistent with bilateral tubo-ovarian abscesses. 3. Small amount of free fluid in the pelvis. Electronically Signed   By: Constance Holster M.D.   On: 06/06/2019 19:57   Ct Abdomen Pelvis W Contrast  Result Date: 06/06/2019 CLINICAL DATA:  Abdominal pain and discomfort. EXAM: CT ABDOMEN AND PELVIS WITH CONTRAST TECHNIQUE: Multidetector CT imaging of the abdomen and pelvis was performed using the standard protocol following bolus administration of intravenous contrast. CONTRAST:  152m OMNIPAQUE IOHEXOL 300 MG/ML  SOLN COMPARISON:  None. FINDINGS: Lower chest: No acute abnormality. Hepatobiliary: The liver is unremarkable. Multiple large gallstones are noted. The gallbladder is moderately distended without evidence of acute cholecystitis. Pancreas: Unremarkable. No pancreatic ductal dilatation or surrounding inflammatory changes. Spleen: Normal in size without focal abnormality. Adrenals/Urinary Tract: Adrenal glands are unremarkable. Kidneys are normal, without renal calculi, focal lesion, or hydronephrosis. Bladder is unremarkable. Stomach/Bowel: There is scattered colonic diverticula without CT evidence of diverticulitis. The appendix is not reliably identified. The stomach is unremarkable. There is no evidence of a small-bowel obstruction. Vascular/Lymphatic: No significant vascular findings are present. No enlarged abdominal or pelvic lymph nodes. Reproductive: There are extensive inflammatory changes in the patient's pelvis. There are 2 rim enhancing collections in the pelvis, with 1 measuring approximately 5 cm, while the other measures approximately 3 cm. There are organizing collections in the pelvis posteriorly measuring up to approximately 7  cm. There is a small amount of free fluid in the pelvis, likely reactive. Other: No abdominal wall hernia or abnormality. No  abdominopelvic ascites. Musculoskeletal: No acute or significant osseous findings. IMPRESSION: 1. Overall findings concerning for pelvic inflammatory disease with development of tubo-ovarian abscesses. 2. The appendix is not reliably identified. While the inflammatory changes in the patient's pelvis are favored to be secondary to PID, acute appendicitis is not excluded in this patient. 3. Cholelithiasis without CT evidence of acute cholecystitis. Electronically Signed   By: Constance Holster M.D.   On: 06/06/2019 15:24   [place] Art/ven Flow Abd Pelv Doppler  Result Date: 06/06/2019 CLINICAL DATA:  Sudden onset of abdominal pain. EXAM: TRANSABDOMINAL AND TRANSVAGINAL ULTRASOUND OF PELVIS DOPPLER ULTRASOUND OF OVARIES TECHNIQUE: Both transabdominal and transvaginal ultrasound examinations of the pelvis were performed. Transabdominal technique was performed for global imaging of the pelvis including uterus, ovaries, adnexal regions, and pelvic cul-de-sac. It was necessary to proceed with endovaginal exam following the transabdominal exam to visualize the ovaries. Color and duplex Doppler ultrasound was utilized to evaluate blood flow to the ovaries. COMPARISON:  None. FINDINGS: Uterus Measurements: 7.5 x 3.9 x 4.2 cm = volume: 63.7 mL. The uterus is heterogeneous. There is evidence of a prior Caesarean section scar which limits evaluation. Endometrium Thickness: 8.  No focal abnormality visualized. Right ovary Measurements: 5.5 x 3.6 x 3.8 cm = volume: 39 mL. There is a complex 3.4 x 2.2 x 3.2 cm cystic structure involving the right ovary. This structure contains a thin internal septation. In the region of the right adnexa there is a tubular structure with filled with complex fluid. Left ovary Measurements: 6.3 x 4.2 x 5.8 cm = volume: 82 mL. There is a 5 x 3.5 x 5.1 cm cystic structure with low level internal echoes. There appears to be an adjacent tubular structure with complex fluid. Pulsed Doppler evaluation of  both ovaries demonstrates normal low-resistance arterial and venous waveforms. Other findings There is a small amount of free fluid in the pelvis. IMPRESSION: 1. No sonographic evidence for ovarian torsion. 2. Findings consistent with bilateral tubo-ovarian abscesses. 3. Small amount of free fluid in the pelvis. Electronically Signed   By: Constance Holster M.D.   On: 06/06/2019 19:57    Review of Systems  Constitutional: Negative for chills, fever and weight loss.  Respiratory: Negative for cough and shortness of breath.   Gastrointestinal: Positive for abdominal pain and vomiting. Negative for diarrhea and nausea.  Skin: Negative for rash.  All other systems reviewed and are negative.  Blood pressure 92/69, pulse (!) 118, temperature 98.5 F (36.9 C), temperature source Oral, resp. rate (!) 21, height 5' 6"  (1.676 m), weight 62.2 kg, SpO2 100 %. Physical Exam  Constitutional: She appears well-developed and well-nourished.  HENT:  Head: Normocephalic.  Eyes: Pupils are equal, round, and reactive to light.  Neck: Normal range of motion.  Cardiovascular:  Tachycardia sinus rhythm  Respiratory: Effort normal.  GI: There is abdominal tenderness in the right lower quadrant, suprapubic area and left lower quadrant. There is rebound and guarding. There is no rigidity.  Musculoskeletal: Normal range of motion.  Neurological: She is alert.  Skin: Skin is warm and dry.  Psychiatric: She has a normal mood and affect. Her behavior is normal.    Assessment/Plan: Abdominal pain  Multiple's moderate sized pelvic abscesses and findings consistent with pelvic inflammatory disease even though history of risk factors not present  I reviewed her CT scan and can  see what appears to be the appendix.  Of note, the inflammatory process is not near the cecum either which makes appendicitis and perforated appendicitis less likely.  Gynecology to evaluate  I discussed the findings with the patient.  My  recommendation would be IV antibiotics and fluid resuscitation.  I think she would benefit from a IR consultation in the morning and possible percutaneous drainage of these collections with culture.  Etiology is unclear.  Of note the EDP who I spoke with stated a pelvic exam and there is a small amount of purulent-like material but he did not feel this was excessive and did not feel that look consistent with typical PID.  I explained the patient that further work-up may be necessary to include a colonoscopy but for the time being the goal was to control the infection with antibiotics and percutaneous drainage.  Operative intervention would be necessary if the above means fail.  CCS to follow  Marcello Moores A Creed Kail 06/06/2019, 10:44 PM

## 2019-06-06 NOTE — ED Triage Notes (Signed)
C/o abd pain started last night-NAD-steady gait

## 2019-06-06 NOTE — Consult Note (Signed)
Chief complaint: Abdominal pain  History of present illness: 48 year old nonpregnant G2, P2 presents with acute onset of sharp lower abdominal pain.  Patient noted onset yesterday around 9 PM.  Single episode of nausea and emesis several hours later.  Given persistent pain this a.m. patient presented to the ED for further evaluation.  Patient is married and in a monogamous relationship.  Patient reports last intercourse about 1 week ago and was not painful.  Patient reports no vaginal discharge.  Patient does note odor to her urine about 1 week ago.  Patient did not seek treatment and instead decided to increase water intake and cranberry juice.  Patient did not note odor or vaginal discharge.  No menses secondary to endometrial ablation October 2019.  Patient with no prior history of pelvic pain but on ultrasound work-up for her menorrhagia ovarian cysts were noted.  The cysts were consistent with endometrioma despite no prior history concerning for endometriosis.  Patient reports no history of infertility.  Patient reports no prior intra-abdominal surgery other than C-section x2 and the ablation.  She does have history of abdominoplasty.  Known ovarian cysts.  Ultrasound May 2020 shows left complex cyst 3.3 x 2.0 x 3.1 cm with layering debris consistent with endometrioma.  Also 2.4 cm simple left ovarian cyst.  Complex right ovarian cyst 6.9 x 4.8 x 5.0 cm consistent with endometrioma.  Second complex right ovarian cyst 2.4 x 1.8 x 2.9 with internal nodules with vascularity.  Normal CEA.  Normal Ca1 25  No known drug allergies Past Medical History:  Diagnosis Date  . Gilbert syndrome   . Ovarian cyst   . Palpitations    wore heart monitor - Normal w/PVCs, no current problems    Past Surgical History:  Procedure Laterality Date  . ABDOMINOPLASTY  2010  . BREAST ENHANCEMENT SURGERY  2010  . CESAREAN SECTION     x 2  . DILITATION & CURRETTAGE/HYSTROSCOPY WITH NOVASURE ABLATION N/A 09/27/2018   Procedure: DILATATION & CURETTAGE/HYSTEROSCOPY WITH NOVASURE ABLATION;  Surgeon: Brien Few, MD;  Location: [facility];  Service: Gynecology;  Laterality: N/A;  . EYE SURGERY Bilateral    bilateral  . WISDOM TOOTH EXTRACTION      PE:  Vitals:   06/06/19 1814 06/06/19 2004 06/06/19 2007 06/06/19 2200  BP:   92/69   Pulse:   (!) 106 (!) 118  Resp:    (!) 21  Temp: 100.3 F (37.9 C) 99.9 F (37.7 C)  98.5 F (36.9 C)  TempSrc: Oral Oral  Oral  SpO2:    100%  Weight:  62.2 kg    Height:  5' 6"  (1.676 m)     General: Lying in bed, still Skin: Hot and dry Cardiovascular regular rate and rhythm Pulmonary: Clear to auscultation bilaterally Abdomen: No costovertebral angle tenderness.  Abdominoplasty scar.  Diffuse tenderness in right and left lower quadrant.  Rebound and guarding present.  Soft distention.  No rigidity.  No tympany Lymphatic: No inguinal adenopathy Neurologic: Alert Psychiatric: Normal mood and affect.  Appropriately concerned given situation GU: No bladder tenderness, no abnormal vaginal discharge, no cervical motion tenderness, bimanual exam limited by abdominal tenderness, posterior uterine pain and peritoneal pain with pushing cervix anteriorly. Lower extremity: No edema, SCDs in place  CBC    Component Value Date/Time   WBC 14.3 (H) 06/06/2019 1301   RBC 4.92 06/06/2019 1301   HGB 14.3 06/06/2019 1301   HCT 42.9 06/06/2019 1301   PLT 190 06/06/2019 1301  MCV 87.2 06/06/2019 1301   MCH 29.1 06/06/2019 1301   MCHC 33.3 06/06/2019 1301   RDW 11.7 06/06/2019 1301   LYMPHSABS 1.1 06/06/2019 1301   MONOABS 0.6 06/06/2019 1301   EOSABS 0.0 06/06/2019 1301   BASOSABS 0.0 06/06/2019 1301    CT: Reproductive: There are extensive inflammatory changes in the patient's pelvis. There are 2 rim enhancing collections in the pelvis, with 1 measuring approximately 5 cm, while the other measures approximately 3 cm. There are organizing collections in the pelvis  posteriorly measuring up to approximately 7 cm. There is a small amount of free fluid in the pelvis, likely reactive.  U/s: Right ovary  Measurements: 5.5 x 3.6 x 3.8 cm = volume: 39 mL. There is a complex 3.4 x 2.2 x 3.2 cm cystic structure involving the right ovary. This structure contains a thin internal septation. In the region of the right adnexa there is a tubular structure with filled with complex fluid.  Left ovary  Measurements: 6.3 x 4.2 x 5.8 cm = volume: 82 mL. There is a 5 x 3.5 x 5.1 cm cystic structure with low level internal echoes. There appears to be an adjacent tubular structure with complex fluid.  Pulsed Doppler evaluation of both ovaries demonstrates normal low-resistance arterial and venous waveforms.  Other findings  There is a small amount of free fluid in the pelvis.  IMPRESSION: 1. No sonographic evidence for ovarian torsion. 2. Findings consistent with bilateral tubo-ovarian abscesses. 3. Small amount of free fluid in the pelvis.  Assessment plan: 48 year old nonpregnant patient with acute abdominal pelvic pain and both CT and ultrasound findings of bilateral tubo-ovarian abscesses.  Patient with no particular risk factor for TOA and ruptured appendix initially in the differential diagnosis.  Per surgery consult they can see normal appendix on CT scan.  This is in contrast to the radiologist noting "the appendix is not reliably identified".  Giving rim-enhancing collections on CT scan we will continue to treat for pelvic abscesses while also continuing to search for the etiology.  We will continue Zosyn as a broad-spectrum antibiotic and add doxycycline for particular GYN pathogens.  Gonorrhea and Chlamydia cultures sent and pending.  Urine culture and blood cultures pending.  Agree with general surgery for attempt at VIR drainage of abscesses in a.m.  Plan reviewed with patient for IV antibiotics, consideration of VIR guided drainage and with surgery  as a last resort.  Appreciate hospitalist service admitting patient.   Repeat labs in am, NPO o/n, SCDs in place  [place] 06/06/2019 11:21 PM

## 2019-06-06 NOTE — ED Notes (Signed)
Patient transported to CT 

## 2019-06-06 NOTE — H&P (Addendum)
History and Physical    [name] [name] URK:[id number] DOB: 1971/07/14 DOA: 06/06/2019  PCP: Patient, [name] Patient coming from: [facility]  Chief Complaint: Abdominal pain  HPI: [name] [name] is a 48 y.o. female with medical history significant of Gilbert syndrome, ovarian cyst, C-section x2, endometrial ablation, abdominoplasty presenting to the hospital for evaluation of abdominal pain.  Patient reports 1 day history of excruciating generalized abdominal pain.  The pain is intermittent and sharp in nature.  It is worse even with little movement.  It is associated with nausea and vomiting.  No diarrhea and she is having normal bowel movements.  She is having low-grade fevers at home.  States is currently sexually active and does not use barrier contraceptives as she is married and in a monogamous relationship.  Denies any abnormal vaginal discharge.  States she has recently noticed that her urine is foul-smelling.  Denies any dysuria, urinary frequency, or urgency.  Denies any chest pain, shortness of breath, or cough.  No other complaints.  ED Course at [facility]: T-max 100.3 F, tachycardic, blood pressure soft.  White count 83.1 with neutrophilic predominance.  Lactic acid normal.  T bili 2.8, remainder of LFTs normal.  Lipase normal.  UA with positive nitrite, 11-20 WBCs, and many bacteria.  Urine culture pending.  Urine pregnancy test negative.  Wet prep showing many WBCs.  GC chlamydia probe pending.  COVID-19 rapid test negative.  Blood culture x2 pending.  CT abdomen pelvis showing findings concerning for pelvic inflammatory disease with development of tubal ovarian abscesses.  The appendix is not reliably identified.  While the inflammatory changes in the patient's pelvis are favored to be secondary to PID, acute appendicitis is not excluded.  Pelvic ultrasound without evidence of ovarian torsion.  Showing findings consistent with bilateral tubo-ovarian abscesses  and small amount of free fluid in the pelvis.  Patient received 1 L fluid bolus, Zosyn, ceftriaxone, Zofran, fentanyl, and morphine in the ED.  Review of Systems:  All systems reviewed and apart from history of presenting illness, are negative.  Past Medical History:  Diagnosis Date   [name] [name] syndrome    Ovarian cyst    Palpitations    wore heart monitor - Normal w/PVCs, no current problems    Past Surgical History:  Procedure Laterality Date   ABDOMINOPLASTY  2010   BREAST ENHANCEMENT SURGERY  2010   CESAREAN SECTION     x 2   DILITATION & CURRETTAGE/HYSTROSCOPY WITH NOVASURE ABLATION N/A 09/27/2018   Procedure: DILATATION & CURETTAGE/HYSTEROSCOPY WITH NOVASURE ABLATION;  Surgeon: Brien Few, MD;  Location: [facility];  Service: Gynecology;  Laterality: N/A;   EYE SURGERY Bilateral    bilateral   WISDOM TOOTH EXTRACTION       reports that she has never smoked. She has never used smokeless tobacco. She reports current alcohol use. She reports that she does not use drugs.  No Known Allergies  Family History  Problem Relation Age of Onset   Kidney cancer Mother    Prostate cancer Father     Prior to Admission medications   Medication Sig Start Date End Date Taking? Authorizing Provider  traMADol (ULTRAM) 50 MG tablet Take 1-2 tablets (50-100 mg total) by mouth every 6 (six) hours as needed. 09/27/18   Brien Few, MD    Physical Exam: Vitals:   06/06/19 1740 06/06/19 1814 06/06/19 2004 06/06/19 2007  BP: 118/75   92/69  Pulse: (!) 115   Marland Kitchen)  106  Resp: 16     Temp:  100.3 F (37.9 C) 99.9 F (37.7 C)   TempSrc:  Oral Oral   SpO2: 96%     Weight:   62.2 kg   Height:   5' 6"  (1.676 m)     Physical Exam  Constitutional: She is oriented to person, place, and time. She appears well-developed and well-nourished. No distress.  HENT:  Head: Normocephalic.  Dry mucous membranes  Eyes: Right eye exhibits no discharge. Left eye exhibits no discharge.    Neck: Neck supple.  Cardiovascular: Normal rate, regular rhythm and intact distal pulses.  Pulmonary/Chest: Effort normal and breath sounds normal. No respiratory distress. She has no wheezes. She has no rales.  Abdominal: Bowel sounds are normal. She exhibits no distension. There is abdominal tenderness. There is rebound and guarding.  Generalized tenderness to palpation with rebound and guarding  Musculoskeletal:        General: No edema.  Neurological: She is alert and oriented to person, place, and time.  Skin: Skin is warm and dry. She is not diaphoretic.     Labs on Admission: I have personally reviewed following labs and imaging studies  CBC: Recent Labs  Lab 06/06/19 1301  WBC 14.3*  NEUTROABS 12.6*  HGB 14.3  HCT 42.9  MCV 87.2  PLT 355   Basic Metabolic Panel: Recent Labs  Lab 06/06/19 1301  NA 136  K 3.9  CL 99  CO2 27  GLUCOSE 123*  BUN 11  CREATININE 0.69  CALCIUM 9.2   GFR: Estimated Creatinine Clearance: 82.3 mL/min (by C-G formula based on SCr of 0.69 mg/dL). Liver Function Tests: Recent Labs  Lab 06/06/19 1301  AST 17  ALT 14  ALKPHOS 51  BILITOT 2.8*  PROT 7.6  ALBUMIN 4.3   Recent Labs  Lab 06/06/19 1301  LIPASE 33   No results for input(s): AMMONIA in the last 168 hours. Coagulation Profile: No results for input(s): INR, PROTIME in the last 168 hours. Cardiac Enzymes: No results for input(s): CKTOTAL, CKMB, CKMBINDEX, TROPONINI in the last 168 hours. BNP (last 3 results) No results for input(s): PROBNP in the last 8760 hours. HbA1C: No results for input(s): HGBA1C in the last 72 hours. CBG: No results for input(s): GLUCAP in the last 168 hours. Lipid Profile: No results for input(s): CHOL, HDL, LDLCALC, TRIG, CHOLHDL, LDLDIRECT in the last 72 hours. Thyroid Function Tests: No results for input(s): TSH, T4TOTAL, FREET4, T3FREE, THYROIDAB in the last 72 hours. Anemia Panel: No results for input(s): VITAMINB12, FOLATE,  FERRITIN, TIBC, IRON, RETICCTPCT in the last 72 hours. Urine analysis:    Component Value Date/Time   COLORURINE YELLOW 06/06/2019 1301   APPEARANCEUR CLOUDY (A) 06/06/2019 1301   LABSPEC 1.015 06/06/2019 1301   PHURINE 8.0 06/06/2019 1301   GLUCOSEU NEGATIVE 06/06/2019 1301   HGBUR NEGATIVE 06/06/2019 1301   BILIRUBINUR NEGATIVE 06/06/2019 1301   KETONESUR NEGATIVE 06/06/2019 1301   PROTEINUR NEGATIVE 06/06/2019 1301   NITRITE POSITIVE (A) 06/06/2019 1301   LEUKOCYTESUR NEGATIVE 06/06/2019 1301    Radiological Exams on Admission: US Transvaginal Non-ob  Result Date: 06/06/2019 CLINICAL DATA:  Sudden onset of abdominal pain. EXAM: TRANSABDOMINAL AND TRANSVAGINAL ULTRASOUND OF PELVIS DOPPLER ULTRASOUND OF OVARIES TECHNIQUE: Both transabdominal and transvaginal ultrasound examinations of the pelvis were performed. Transabdominal technique was performed for global imaging of the pelvis including uterus, ovaries, adnexal regions, and pelvic cul-de-sac. It was necessary to proceed with endovaginal exam following the transabdominal exam to  visualize the ovaries. Color and duplex Doppler ultrasound was utilized to evaluate blood flow to the ovaries. COMPARISON:  None. FINDINGS: Uterus Measurements: 7.5 x 3.9 x 4.2 cm = volume: 63.7 mL. The uterus is heterogeneous. There is evidence of a prior Caesarean section scar which limits evaluation. Endometrium Thickness: 8.  No focal abnormality visualized. Right ovary Measurements: 5.5 x 3.6 x 3.8 cm = volume: 39 mL. There is a complex 3.4 x 2.2 x 3.2 cm cystic structure involving the right ovary. This structure contains a thin internal septation. In the region of the right adnexa there is a tubular structure with filled with complex fluid. Left ovary Measurements: 6.3 x 4.2 x 5.8 cm = volume: 82 mL. There is a 5 x 3.5 x 5.1 cm cystic structure with low level internal echoes. There appears to be an adjacent tubular structure with complex fluid. Pulsed Doppler  evaluation of both ovaries demonstrates normal low-resistance arterial and venous waveforms. Other findings There is a small amount of free fluid in the pelvis. IMPRESSION: 1. No sonographic evidence for ovarian torsion. 2. Findings consistent with bilateral tubo-ovarian abscesses. 3. Small amount of free fluid in the pelvis. Electronically Signed   By: Constance Holster M.D.   On: 06/06/2019 19:57   US Pelvis Complete  Result Date: 06/06/2019 CLINICAL DATA:  Sudden onset of abdominal pain. EXAM: TRANSABDOMINAL AND TRANSVAGINAL ULTRASOUND OF PELVIS DOPPLER ULTRASOUND OF OVARIES TECHNIQUE: Both transabdominal and transvaginal ultrasound examinations of the pelvis were performed. Transabdominal technique was performed for global imaging of the pelvis including uterus, ovaries, adnexal regions, and pelvic cul-de-sac. It was necessary to proceed with endovaginal exam following the transabdominal exam to visualize the ovaries. Color and duplex Doppler ultrasound was utilized to evaluate blood flow to the ovaries. COMPARISON:  None. FINDINGS: Uterus Measurements: 7.5 x 3.9 x 4.2 cm = volume: 63.7 mL. The uterus is heterogeneous. There is evidence of a prior Caesarean section scar which limits evaluation. Endometrium Thickness: 8.  No focal abnormality visualized. Right ovary Measurements: 5.5 x 3.6 x 3.8 cm = volume: 39 mL. There is a complex 3.4 x 2.2 x 3.2 cm cystic structure involving the right ovary. This structure contains a thin internal septation. In the region of the right adnexa there is a tubular structure with filled with complex fluid. Left ovary Measurements: 6.3 x 4.2 x 5.8 cm = volume: 82 mL. There is a 5 x 3.5 x 5.1 cm cystic structure with low level internal echoes. There appears to be an adjacent tubular structure with complex fluid. Pulsed Doppler evaluation of both ovaries demonstrates normal low-resistance arterial and venous waveforms. Other findings There is a small amount of free fluid in the  pelvis. IMPRESSION: 1. No sonographic evidence for ovarian torsion. 2. Findings consistent with bilateral tubo-ovarian abscesses. 3. Small amount of free fluid in the pelvis. Electronically Signed   By: Constance Holster M.D.   On: 06/06/2019 19:57   Ct Abdomen Pelvis W Contrast  Result Date: 06/06/2019 CLINICAL DATA:  Abdominal pain and discomfort. EXAM: CT ABDOMEN AND PELVIS WITH CONTRAST TECHNIQUE: Multidetector CT imaging of the abdomen and pelvis was performed using the standard protocol following bolus administration of intravenous contrast. CONTRAST:  17m OMNIPAQUE IOHEXOL 300 MG/ML  SOLN COMPARISON:  None. FINDINGS: Lower chest: No acute abnormality. Hepatobiliary: The liver is unremarkable. Multiple large gallstones are noted. The gallbladder is moderately distended without evidence of acute cholecystitis. Pancreas: Unremarkable. No pancreatic ductal dilatation or surrounding inflammatory changes. Spleen: Normal in size without  focal abnormality. Adrenals/Urinary Tract: Adrenal glands are unremarkable. Kidneys are normal, without renal calculi, focal lesion, or hydronephrosis. Bladder is unremarkable. Stomach/Bowel: There is scattered colonic diverticula without CT evidence of diverticulitis. The appendix is not reliably identified. The stomach is unremarkable. There is no evidence of a small-bowel obstruction. Vascular/Lymphatic: No significant vascular findings are present. No enlarged abdominal or pelvic lymph nodes. Reproductive: There are extensive inflammatory changes in the patient's pelvis. There are 2 rim enhancing collections in the pelvis, with 1 measuring approximately 5 cm, while the other measures approximately 3 cm. There are organizing collections in the pelvis posteriorly measuring up to approximately 7 cm. There is a small amount of free fluid in the pelvis, likely reactive. Other: No abdominal wall hernia or abnormality. No abdominopelvic ascites. Musculoskeletal: No acute or  significant osseous findings. IMPRESSION: 1. Overall findings concerning for pelvic inflammatory disease with development of tubo-ovarian abscesses. 2. The appendix is not reliably identified. While the inflammatory changes in the patient's pelvis are favored to be secondary to PID, acute appendicitis is not excluded in this patient. 3. Cholelithiasis without CT evidence of acute cholecystitis. Electronically Signed   By: Constance Holster M.D.   On: 06/06/2019 15:24   [place] Art/ven Flow Abd Pelv Doppler  Result Date: 06/06/2019 CLINICAL DATA:  Sudden onset of abdominal pain. EXAM: TRANSABDOMINAL AND TRANSVAGINAL ULTRASOUND OF PELVIS DOPPLER ULTRASOUND OF OVARIES TECHNIQUE: Both transabdominal and transvaginal ultrasound examinations of the pelvis were performed. Transabdominal technique was performed for global imaging of the pelvis including uterus, ovaries, adnexal regions, and pelvic cul-de-sac. It was necessary to proceed with endovaginal exam following the transabdominal exam to visualize the ovaries. Color and duplex Doppler ultrasound was utilized to evaluate blood flow to the ovaries. COMPARISON:  None. FINDINGS: Uterus Measurements: 7.5 x 3.9 x 4.2 cm = volume: 63.7 mL. The uterus is heterogeneous. There is evidence of a prior Caesarean section scar which limits evaluation. Endometrium Thickness: 8.  No focal abnormality visualized. Right ovary Measurements: 5.5 x 3.6 x 3.8 cm = volume: 39 mL. There is a complex 3.4 x 2.2 x 3.2 cm cystic structure involving the right ovary. This structure contains a thin internal septation. In the region of the right adnexa there is a tubular structure with filled with complex fluid. Left ovary Measurements: 6.3 x 4.2 x 5.8 cm = volume: 82 mL. There is a 5 x 3.5 x 5.1 cm cystic structure with low level internal echoes. There appears to be an adjacent tubular structure with complex fluid. Pulsed Doppler evaluation of both ovaries demonstrates normal low-resistance  arterial and venous waveforms. Other findings There is a small amount of free fluid in the pelvis. IMPRESSION: 1. No sonographic evidence for ovarian torsion. 2. Findings consistent with bilateral tubo-ovarian abscesses. 3. Small amount of free fluid in the pelvis. Electronically Signed   By: Constance Holster M.D.   On: 06/06/2019 19:57    Assessment/Plan Principal Problem:   Sepsis ([place]) Active Problems:   TOA (tubo-ovarian abscess)   PID (acute pelvic inflammatory disease)   UTI (urinary tract infection)   [name] [name] syndrome   Sepsis secondary to PID and tubo-ovarian abscesses versus acute appendicitis T-max 100.3 F, tachycardic, blood pressure soft.  White count 76.7 with neutrophilic predominance.  Lactic acid normal.   Wet prep showing many WBCs. CT abdomen pelvis showing findings concerning for pelvic inflammatory disease with development of tubal ovarian abscesses.  The appendix is not reliably identified.  While the inflammatory changes in the patient's pelvis are  favored to be secondary to PID, acute appendicitis is not excluded.  Pelvic ultrasound without evidence of ovarian torsion.  Showing findings consistent with bilateral tubo-ovarian abscesses and small amount of free fluid in the pelvis. -Exam concerning for peritonitis -guarding and rebound. I have discussed the case with both OB/GYN and general surgery, they will consult tonight. -Monitor closely in the progressive care unit -IV fluid resuscitation -IV Zosyn -IV morphine PRN -Blood culture x2 pending -G/C chlamydia probe pending -Keep n.p.o. -Continue to monitor CBC  UTI, sepsis Febrile, tachycardic, blood pressure soft.  White count 14.3.  Lactic acid normal. UA with positive nitrite, 11-20 WBCs, and many bacteria.  -IV fluid resuscitation -IV Zosyn as mentioned above -Urine culture pending -Blood culture x2 pending  History of Gilbert syndrome T bili 2.8, remainder of LFTs normal.  No prior LFTs in the chart for  comparison.  CT with evidence of cholelithiasis.  -Right upper quadrant ultrasound  HIV screening The patient falls between the ages of 13-64 and should be screened for HIV, therefore HIV testing ordered.  DVT prophylaxis: SCDs Code Status: Full code Family Communication: No family available. Disposition Plan: Anticipate discharge after clinical improvement. Consults called: OB/GYN (Dr. Valentino Saxon), general surgery (Dr. Brantley Stage) Admission status: It is my clinical opinion that admission to INPATIENT is reasonable and necessary in this 48 y.o. female  presenting with sepsis secondary to PID and tubo-ovarian abscesses versus acute appendicitis.  Please read note above.  Receiving IV antibiotic, IV fluid resuscitation, and IV analgesic.  The patient is at high risk for clinical deterioration.  General surgery and OB/GYN will consult.  Given the aforementioned, the predictability of an adverse outcome is felt to be significant. I expect that the patient will require at least 2 midnights in the hospital to treat this condition.   The medical decision making on this patient was of high complexity and the patient is at high risk for clinical deterioration, therefore this is a level 3 visit.  Shela Leff MD [facility] Pager [phone]  If 7PM-7AM, please contact night-coverage [email or web address] Password [facility]  06/06/2019, 9:50 PM

## 2019-06-06 NOTE — ED Notes (Signed)
Patient transported to Ultrasound 

## 2019-06-06 NOTE — Progress Notes (Signed)
Patient has refused to have a repeat Cov-19 test Dr. Marlowe Sax aware.

## 2019-06-06 NOTE — ED Notes (Signed)
Spoke to [facility] for consult to general surgery

## 2019-06-06 NOTE — ED Provider Notes (Signed)
Patient handed off to me at 3 PM.  Patient with abdominal pain.  Low-grade fever.  Tachycardia.  Leukocytosis.  Urinalysis shows possible infection.  Has been given IV Rocephin.  CT scan to rule out intra-abdominal process or infection.  Patient with CT scan that shows possible pelvic inflammatory changes, multiple possible tubo-ovarian abscesses.  Appendix is not identified and could possibly be acute appendicitis.  Talked with Dr. Valentino Saxon with OB/GYN as patient follows with Dr. Cherylann Banas as an outpatient history of ovarian cyst, endometrial issues.  Patient had many white blood cells on wet prep.  Did have some mild discharge around the cervix and had some tenderness.  However, patient is in a monogamous relationship.  Gonorrhea and Chlamydia testing is pending.  Overall does not have consistent picture with TOA but OB/GYN recommends consultation with general surgery as well.  Talk to Dr. Brantley Stage and they suggest that if appendix is not found and they are abscesses this is likely a ruptured appendix or TOA.  Either way patient should be admitted to the hospitalist service at [facility] whereas they and OB/GYN can consult.  In the end this may become an IR case.  After talking with both OB and general surgery we will add IV Zosyn.  Patient to be admitted to [facility] hospitalist service for further sepsis care likely from intra-abdominal versus intra-pelvic infection.  Hemodynamically stable throughout my care. COVID test sent, blood cx, urine cx and pelvic u/s has been ordered as well and will perform U/S here in case transport is delayed.  Pelvic ultrasound has been performed but not read by radiology prior to transfer.  Patient was transferred to [facility] prior to images being read.  Both OB/GYN and general surgery are aware, patient to be admitted to the hospitalist service under Dr. Lonny Prude.  Hemodynamically stable throughout my care.  This chart was dictated using voice recognition software.  Despite  best efforts to proofread,  errors can occur which can change the documentation meaning.   .Critical Care Performed by: Lennice Sites, DO Authorized by: Lennice Sites, DO   Critical care provider statement:    Critical care time (minutes):  45   Critical care was necessary to treat or prevent imminent or life-threatening deterioration of the following conditions:  Sepsis   Critical care was time spent personally by me on the following activities:  Blood draw for specimens, development of treatment plan with patient or surrogate, discussions with consultants, discussions with primary provider, evaluation of patient's response to treatment, examination of patient, obtaining history from patient or surrogate, ordering and performing treatments and interventions, ordering and review of laboratory studies, ordering and review of radiographic studies, pulse oximetry, re-evaluation of patient's condition and review of old charts      Lennice Sites, DO 06/06/19 [place]

## 2019-06-07 LAB — BASIC METABOLIC PANEL
Anion gap: 9 (ref 5–15)
BUN: 8 mg/dL (ref 6–20)
CO2: 22 mmol/L (ref 22–32)
Calcium: 8.1 mg/dL — ABNORMAL LOW (ref 8.9–10.3)
Chloride: 106 mmol/L (ref 98–111)
Creatinine, Ser: 0.83 mg/dL (ref 0.44–1.00)
GFR calc Af Amer: >60 mL/min (ref >60)
GFR calc non Af Amer: >60 mL/min (ref >60)
Glucose, Bld: 86 mg/dL (ref 70–99)
Potassium: 3.5 mmol/L (ref 3.5–5.1)
Sodium: 137 mmol/L (ref 135–145)

## 2019-06-07 LAB — GC/CHLAMYDIA PROBE AMP (~~LOC~~) NOT AT ARMC
Chlamydia: NEGATIVE
Chlamydia: NEGATIVE
Neisseria Gonorrhea: NEGATIVE
Neisseria Gonorrhea: NEGATIVE

## 2019-06-07 LAB — CBC WITH DIFFERENTIAL/PLATELET
Abs Immature Granulocytes: 0.04 10*3/uL (ref 0.00–0.07)
Basophils Absolute: 0 10*3/uL (ref 0.0–0.1)
Basophils Relative: 0 %
Eosinophils Absolute: 0 10*3/uL (ref 0.0–0.5)
Eosinophils Relative: 0 %
HCT: 36.6 % (ref 36.0–46.0)
Hemoglobin: 12.3 g/dL (ref 12.0–15.0)
Immature Granulocytes: 0 %
Lymphocytes Relative: 12 %
Lymphs Abs: 1.4 10*3/uL (ref 0.7–4.0)
MCH: 29.5 pg (ref 26.0–34.0)
MCHC: 33.6 g/dL (ref 30.0–36.0)
MCV: 87.8 fL (ref 80.0–100.0)
Monocytes Absolute: 0.6 10*3/uL (ref 0.1–1.0)
Monocytes Relative: 5 %
Neutro Abs: 9.2 10*3/uL — ABNORMAL HIGH (ref 1.7–7.7)
Neutrophils Relative %: 83 %
Platelets: 158 10*3/uL (ref 150–400)
RBC: 4.17 MIL/uL (ref 3.87–5.11)
RDW: 11.8 % (ref 11.5–15.5)
WBC: 11.2 10*3/uL — ABNORMAL HIGH (ref 4.0–10.5)
nRBC: 0 % (ref 0.0–0.2)

## 2019-06-07 LAB — HEPATIC FUNCTION PANEL
ALT: 10 U/L (ref 0–44)
AST: 14 U/L — ABNORMAL LOW (ref 15–41)
Albumin: 3.2 g/dL — ABNORMAL LOW (ref 3.5–5.0)
Alkaline Phosphatase: 45 U/L (ref 38–126)
Bilirubin, Direct: 0.3 mg/dL — ABNORMAL HIGH (ref 0.0–0.2)
Indirect Bilirubin: 3 mg/dL — ABNORMAL HIGH (ref 0.3–0.9)
Total Bilirubin: 3.3 mg/dL — ABNORMAL HIGH (ref 0.3–1.2)
Total Protein: 5.9 g/dL — ABNORMAL LOW (ref 6.5–8.1)

## 2019-06-07 LAB — HIV ANTIBODY (ROUTINE TESTING W REFLEX): HIV Screen 4th Generation wRfx: NONREACTIVE

## 2019-06-07 MED ORDER — ENOXAPARIN SODIUM 40 MG/0.4ML ~~LOC~~ SOLN
40.0000 mg | SUBCUTANEOUS | Status: DC
  Administered 2019-06-07: 40 mg via SUBCUTANEOUS
  Filled 2019-06-07: qty 0.4

## 2019-06-07 MED ORDER — SODIUM CHLORIDE 0.9 % IV SOLN
INTRAVENOUS | Status: AC
  Administered 2019-06-07: 15:00:00 via INTRAVENOUS

## 2019-06-07 NOTE — Progress Notes (Signed)
    CC: Abdominal pain  Subjective: Patient is in bed.  Abdomen still tender and uncomfortable.  Objective: Vital signs in last 24 hours: Temp:  [98.1 F (36.7 C)-100.3 F (37.9 C)] 99.2 F (37.3 C) (06/19 0811) Pulse Rate:  [97-118] 104 (06/19 0811) Resp:  [14-21] 18 (06/19 0811) BP: (92-119)/(63-81) 97/67 (06/19 0811) SpO2:  [95 %-100 %] 100 % (06/19 0811) Weight:  [62.1 kg-62.2 kg] 62.2 kg (06/18 2004) Last BM Date: 06/05/19  Intake/Output from previous day: 06/18 0701 - 06/19 0700 In: 2098 [I.V.:628; IV Piggyback:1470] Out: 700 [Urine:700] Intake/Output this shift: No intake/output data recorded.  General appearance: alert, cooperative and no distress GI: not examined  Lab Results:  Recent Labs    06/06/19 1301 06/07/19 0359  WBC 14.3* 11.2*  HGB 14.3 12.3  HCT 42.9 36.6  PLT 190 158    BMET Recent Labs    06/06/19 1301 06/07/19 0359  NA 136 137  K 3.9 3.5  CL 99 106  CO2 27 22  GLUCOSE 123* 86  BUN 11 8  CREATININE 0.69 0.83  CALCIUM 9.2 8.1*   PT/INR No results for input(s): LABPROT, INR in the last 72 hours.  Recent Labs  Lab 06/06/19 1301 06/07/19 0359  AST 17 14*  ALT 14 10  ALKPHOS 51 45  BILITOT 2.8* 3.3*  PROT 7.6 5.9*  ALBUMIN 4.3 3.2*     Lipase     Component Value Date/Time   LIPASE 33 06/06/2019 1301   . sodium chloride Stopped (06/06/19 1821)  . doxycycline (VIBRAMYCIN) IV 100 mg (06/07/19 0239)  . piperacillin-tazobactam (ZOSYN)  IV 3.375 g (06/07/19 0612)  . sodium chloride Stopped (06/06/19 2128)  Currently I  Do not see an IV fluid order   Medications:   Assessment/Plan Hx of Gilberts syndrome Hx ovarian cyst  Abdominal pain Tubo-ovarian abscess Inflammatory changes of the pelvis Cholelithiasis without cholecystitis  FEN:  NPO/IV fluids ID: Rocephin x 1; Doxycycline IV6/19>> day 1; Zosyn 6/18 >> day 2 DVT:  SCD's Follow up: Medicine/Gyn  Plan:  Dr. Brantley Stage and Dr. Rosendo Gros have reviewed the CT and  agree with diagnosis of tubo-ovarian abscess.  Some irritation of the pelvis, but in their opinion pt does not have appendicitis.  Will defer to Medicine and Gyn.  We will be available as needed, please feel free to call.  Dr. Rosendo Gros has seen and discussed with the patient.    LOS: 1 day    [name] [name] 06/07/2019 [phone]

## 2019-06-07 NOTE — Progress Notes (Signed)
Patient ID: [name] [name] [place], female   DOB: [birth year], 48 y.o.   MRN: [id number] Continues to feel better. Pain improved Tolerating regular diet BP 113/79 (BP Location: Left Arm)   Pulse 91   Temp 98.4 F (36.9 C) (Oral)   Resp 11   Ht 5' 6"  (1.676 m)   Wt 62.2 kg   SpO2 100%   BMI 22.13 kg/m   Continue IV abx Rpt CBC in am.

## 2019-06-07 NOTE — Progress Notes (Signed)
Hospital day #2 pelvic abscesses likely secondary to bilateral tubo-ovarian abscesses  Subjective: Patient notes feeling significantly better today.  Patient hungry and asking to eat.  Patient able to sleep off and on last night.  Patient feels like she can move better without pain though when rolling from one side to the other will have throbbing lower quadrant pain for several minutes.  Patient notes positive flatus.  Objective: Vitals:   06/06/19 2200 06/07/19 0030 06/07/19 0310 06/07/19 0811  BP:  107/70 100/65 97/67  Pulse: (!) 118 (!) 103 97 (!) 104  Resp: (!) 21 20 18 18   Temp: 98.5 F (36.9 C) 99.2 F (37.3 C) 98.1 F (36.7 C) 99.2 F (37.3 C)  TempSrc: Oral Oral Oral Oral  SpO2: 100% 99% 100% 100%  Weight:      Height:       General: Alert and cooperative, no acute distress Cardiovascular: Regular rate and rhythm Pulmonary: Clear to auscultation bilaterally Abdomen: Tender throughout, improved rebound throughout,  significant guarding in the right lower quadrant, intermittent and less pronounced guarding in the left lower quadrant, not rigid GU: Deferred Lower extremity: Nontender, SCDs in place  CBC Latest Ref Rng & Units 06/07/2019 06/06/2019  WBC 4.0 - 10.5 K/uL 11.2(H) 14.3(H)  Hemoglobin 12.0 - 15.0 g/dL 12.3 14.3  Hematocrit 36.0 - 46.0 % 36.6 42.9  Platelets 150 - 400 K/uL 158 190   CMP Latest Ref Rng & Units 06/07/2019 06/06/2019  Glucose 70 - 99 mg/dL 86 123(H)  BUN 6 - 20 mg/dL 8 11  Creatinine 0.44 - 1.00 mg/dL 0.83 0.69  Sodium 135 - 145 mmol/L 137 136  Potassium 3.5 - 5.1 mmol/L 3.5 3.9  Chloride 98 - 111 mmol/L 106 99  CO2 22 - 32 mmol/L 22 27  Calcium 8.9 - 10.3 mg/dL 8.1(L) 9.2  Total Protein 6.5 - 8.1 g/dL 5.9(L) 7.6  Total Bilirubin 0.3 - 1.2 mg/dL 3.3(H) 2.8(H)  Alkaline Phos 38 - 126 U/L 45 51  AST 15 - 41 U/L 14(L) 17  ALT 0 - 44 U/L 10 14    Assessment and plan: 48 year old hospital day #2 with improved white count and improved clinical exam  on IV Zosyn and doxycycline being treated for abdominal pain and CT/ultrasound findings of pelvic abscesses most consistent with bilateral tubo-ovarian abscess.  Appreciate surgical consult and coordination of care.  Surgery does not feel this is related to appendicitis given CT findings.  Given clinical improvement will continue IV antibiotics today.  While attempts at interventional radiology drainage of abscesses is a possibility, recommend deferring that today.  Should clinical picture deteriorate will consider repeat CT for evaluation of lesions with possible drainage.  Patient is agreeable to this plan and would prefer medical over surgical management at this time.  Will allow patient to eat today but resume n.p.o. status at midnight should exam tomorrow morning be more concerning.  Repeat CBC with differential in a.m.  Ala Dach 06/07/2019 11:12 AM

## 2019-06-07 NOTE — Progress Notes (Signed)
Patient ID: [name] [name] [place], female   DOB: 1971-10-24, 48 y.o.   MRN: [id number] HD #2 Presumptive TOA on IV Zosyn and Doxy  S: Feeling much better No pain meds needed today Able to ambulate in hall today No chills, No CP or SOB O: BP 109/72 (BP Location: Left Arm)   Pulse 90   Temp 98.2 F (36.8 C) (Oral)   Resp 18   Ht 5' 6"  (1.676 m)   Wt 62.2 kg   SpO2 99%   BMI 22.13 kg/m   Afebrile x 12hrs NCAT WDWN WF NAC Lungs: CTA CV: RRR ABD: soft, minimal guarding in bilateral lower quadrants, no rebound, Pos BS No CVAT EXT: neg c/c/e Neuro: non focal  Skin : intact  Blood cxs neg to date GC and Chlamydia pending CT and Sono reviewed [facility] note reviewed IR note reviewed  A: Abdominal pain- TOA by CT and Sono. No history of PID. No risk factors. Presentation as RLQ pain with low grade fever and leukocytosis most  C/w appendiceal etx. GSURG notes nl appendix on CT per independent review. Clinically improved on IV abx, Tolerating PO , ambulating and afebrile.  P: Continue IV ABX DC NPO order Rpt CBC in am Rpt sono Sunday

## 2019-06-07 NOTE — Progress Notes (Signed)
PROGRESS NOTE  [name] [name] OLM:[id number] DOB: July 20, 1971 DOA: 06/06/2019 PCP: Patient, [name]   LOS: 1 day   Brief narrative: [name] [name] is a 48 y.o. female with medical history significant of Gilbert syndrome, ovarian cyst, C-section x2, endometrial ablation, abdominoplasty presenting to the hospital for evaluation of abdominal pain.  Patient reports 1 day history of excruciating generalized abdominal pain.  The pain is intermittent and sharp in nature.  It is worse even with little movement.  It is associated with nausea and vomiting.  No diarrhea and she is having normal bowel movements.  She is having low-grade fevers at home.  States is currently sexually active and does not use barrier contraceptives as she is married and in a monogamous relationship.  Denies any abnormal vaginal discharge.  States she has recently noticed that her urine is foul-smelling.  Denies any dysuria, urinary frequency, or urgency.  Denies any chest pain, shortness of breath, or cough.  No other complaints.  ED Course: T-max 100.3 F, tachycardic, blood pressure soft.  White count 01.0 with neutrophilic predominance.  Lactic acid normal.  T bili 2.8, remainder of LFTs normal.  Lipase normal.  UA with positive nitrite, 11-20 WBCs, and many bacteria.  Urine culture pending.  Urine pregnancy test negative.  Wet prep showing many WBCs.  GC chlamydia probe pending.  COVID-19 rapid test negative.  Blood culture x2 pending.  CT abdomen pelvis showing findings concerning for pelvic inflammatory disease with development of tubal ovarian abscesses.  The appendix is not reliably identified.  While the inflammatory changes in the patient's pelvis are favored to be secondary to PID, acute appendicitis is not excluded.  Pelvic ultrasound without evidence of ovarian torsion.  Showing findings consistent with bilateral tubo-ovarian abscesses and small amount of free fluid in the pelvis.  Patient received 1 L fluid bolus, Zosyn,  ceftriaxone, Zofran, fentanyl, and morphine in the ED.  Subjective: Patient was seen and examined this afternoon.  Pleasant middle-aged Caucasian female.  Sitting up in bed.  Not in distress.  No new symptoms. T-max 100.3 last night Tachycardia improving, blood pressure improving, low normal Currently on IV Zosyn and doxycycline WBC count improved from 14.3 to 11.2  Assessment/Plan:  Principal Problem:   Sepsis ([place]) Active Problems:   TOA (tubo-ovarian abscess)   PID (acute pelvic inflammatory disease)   UTI (urinary tract infection)   [name] syndrome  Sepsis secondary to PID and tubo-ovarian abscesses versus acute appendicitis -CT scan finding as above.  Met sepsis criteria on admission.  Lactic acid level normal.   -Currently on IV Zosyn, IV ceftriaxone. T-max 100.3 F, tachycardic, blood pressure soft.   -WBC count improved  -OB/GYN and general surgery consult appreciated. -IR was consulted this morning for drainage of pelvic abscess.  Recommended continuation of IV -IV morphine PRN -Blood culture x2 pending -G/C chlamydia probe  -Continue to monitor CBC  UTI -Pending urine culture.  Continue antibiotics.    History of Gilbert syndrome -T bili 2.8, remainder of LFTs normal.  No prior LFTs in the chart for comparison.  CT with evidence of cholelithiasis.  -Right upper quadrant ultrasound  Mobility: Encourage ambulation Diet: Regular diet DVT prophylaxis:  Lovenox Code Status:   Code Status: Full Code  Family Communication:  Expected Discharge:  Remains inpatient for next at least 2 to 3 days  Consultants:  General surgery, IR, GYN  Procedures:  None  Antimicrobials:  Anti-infectives (From admission, onward)   Start  Dose/Rate Route Frequency Ordered Stop   06/07/19 0000  doxycycline (VIBRAMYCIN) 100 mg in sodium chloride 0.9 % 250 mL IVPB     100 mg 125 mL/hr over 120 Minutes Intravenous Every 12 hours 06/06/19 2302     06/06/19 2200   piperacillin-tazobactam (ZOSYN) IVPB 3.375 g     3.375 g 12.5 mL/hr over 240 Minutes Intravenous Every 8 hours 06/06/19 2100     06/06/19 1715  piperacillin-tazobactam (ZOSYN) IVPB 3.375 g     3.375 g 100 mL/hr over 30 Minutes Intravenous  Once 06/06/19 1706 06/06/19 1810   06/06/19 1500  cefTRIAXone (ROCEPHIN) 1 g in sodium chloride 0.9 % 100 mL IVPB     1 g 200 mL/hr over 30 Minutes Intravenous  Once 06/06/19 1452 06/06/19 1607      Infusions:  . sodium chloride Stopped (06/06/19 1821)  . sodium chloride 150 mL/hr at 06/06/19 2301  . doxycycline (VIBRAMYCIN) IV 100 mg (06/07/19 0239)  . piperacillin-tazobactam (ZOSYN)  IV 3.375 g (06/07/19 0612)  . sodium chloride Stopped (06/06/19 2128)    Scheduled Meds:   PRN meds: sodium chloride, acetaminophen **OR** acetaminophen, morphine injection   Objective: Vitals:   06/07/19 0030 06/07/19 0310  BP: 107/70 100/65  Pulse: (!) 103 97  Resp: 20 18  Temp: 99.2 F (37.3 C) 98.1 F (36.7 C)  SpO2: 99% 100%    Intake/Output Summary (Last 24 hours) at 06/07/2019 0739 Last data filed at 06/07/2019 0315 Gross per 24 hour  Intake 2098.03 ml  Output 700 ml  Net 1398.03 ml   Filed Weights   06/06/19 1138 06/06/19 2004  Weight: 62.1 kg 62.2 kg   Weight change:  Body mass index is 22.13 kg/m.   Physical Exam: General exam: Appears calm and comfortable.  Skin: No rashes, lesions or ulcers. HEENT: Atraumatic, normocephalic, supple neck, no obvious bleeding Lungs: Clear to auscultation bilaterally CVS: Regular rate and rhythm, no murmur GI/Abd soft, minimal tenderness in lower abdomen, nondistended, bowel sound present CNS: Alert, awake, oriented x3 Psychiatry: Mood appropriate Extremities: No pedal edema, no calf tenderness  Data Review: I have personally reviewed the laboratory data and studies available.  Recent Labs  Lab 06/06/19 1301 06/07/19 0359  WBC 14.3* 11.2*  NEUTROABS 12.6* 9.2*  HGB 14.3 12.3  HCT 42.9  36.6  MCV 87.2 87.8  PLT 190 158   Recent Labs  Lab 06/06/19 1301 06/07/19 0359  NA 136 137  K 3.9 3.5  CL 99 106  CO2 27 22  GLUCOSE 123* 86  BUN 11 8  CREATININE 0.69 0.83  CALCIUM 9.2 8.1*    Terrilee Croak, MD  [facility] 06/07/2019

## 2019-06-07 NOTE — Progress Notes (Signed)
  Request seen for drain placement to treat TOA.  Dr. Anselm Pancoast reviewed images.  There is no window for safe approach to place drains.  Recommend antibiotics.  Tinsley Lomas S Lorriane Dehart PA-C 06/07/2019 10:06 AM

## 2019-06-08 LAB — CBC WITH DIFFERENTIAL/PLATELET
Abs Immature Granulocytes: 0.03 10*3/uL (ref 0.00–0.07)
Basophils Absolute: 0 10*3/uL (ref 0.0–0.1)
Basophils Relative: 1 %
Eosinophils Absolute: 0.1 10*3/uL (ref 0.0–0.5)
Eosinophils Relative: 1 %
HCT: 34.7 % — ABNORMAL LOW (ref 36.0–46.0)
Hemoglobin: 11.8 g/dL — ABNORMAL LOW (ref 12.0–15.0)
Immature Granulocytes: 0 %
Lymphocytes Relative: 22 %
Lymphs Abs: 1.8 10*3/uL (ref 0.7–4.0)
MCH: 29.8 pg (ref 26.0–34.0)
MCHC: 34 g/dL (ref 30.0–36.0)
MCV: 87.6 fL (ref 80.0–100.0)
Monocytes Absolute: 0.5 10*3/uL (ref 0.1–1.0)
Monocytes Relative: 7 %
Neutro Abs: 5.8 10*3/uL (ref 1.7–7.7)
Neutrophils Relative %: 69 %
Platelets: 150 10*3/uL (ref 150–400)
RBC: 3.96 MIL/uL (ref 3.87–5.11)
RDW: 11.6 % (ref 11.5–15.5)
WBC: 8.3 10*3/uL (ref 4.0–10.5)
nRBC: 0 % (ref 0.0–0.2)

## 2019-06-08 LAB — BASIC METABOLIC PANEL
Anion gap: 7 (ref 5–15)
BUN: 7 mg/dL (ref 6–20)
CO2: 25 mmol/L (ref 22–32)
Calcium: 8.3 mg/dL — ABNORMAL LOW (ref 8.9–10.3)
Chloride: 107 mmol/L (ref 98–111)
Creatinine, Ser: 0.78 mg/dL (ref 0.44–1.00)
GFR calc Af Amer: >60 mL/min (ref >60)
GFR calc non Af Amer: >60 mL/min (ref >60)
Glucose, Bld: 88 mg/dL (ref 70–99)
Potassium: 3.4 mmol/L — ABNORMAL LOW (ref 3.5–5.1)
Sodium: 139 mmol/L (ref 135–145)

## 2019-06-08 LAB — URINE CULTURE: Culture: NO GROWTH

## 2019-06-08 MED ORDER — LOPERAMIDE HCL 2 MG PO CAPS
2.0000 mg | ORAL_CAPSULE | ORAL | Status: DC | PRN
  Administered 2019-06-08 – 2019-06-09 (×2): 2 mg via ORAL
  Filled 2019-06-08 (×2): qty 1

## 2019-06-08 NOTE — Progress Notes (Signed)
Patient ID: Reganne H [place], female   DOB: 05-07-1971, 48 y.o.   MRN: [id number] HD #3 Presumptive TOA on IV Zosyn and Doxy  S: Feeling much better No pain meds needed today Able to ambulate in hall today No chills, No CP or SOB O: BP 103/79 (BP Location: Right Arm)   Pulse 93   Temp 98.2 F (36.8 C) (Oral)   Resp 17   Ht 5' 6"  (1.676 m)   Wt 62.2 kg   SpO2 98%   BMI 22.13 kg/m    CBC    Component Value Date/Time   WBC 8.3 06/08/2019 0316   RBC 3.96 06/08/2019 0316   HGB 11.8 (L) 06/08/2019 0316   HCT 34.7 (L) 06/08/2019 0316   PLT 150 06/08/2019 0316   MCV 87.6 06/08/2019 0316   MCH 29.8 06/08/2019 0316   MCHC 34.0 06/08/2019 0316   RDW 11.6 06/08/2019 0316   LYMPHSABS 1.8 06/08/2019 0316   MONOABS 0.5 06/08/2019 0316   EOSABS 0.1 06/08/2019 0316   BASOSABS 0.0 06/08/2019 0316    Afebrile x 36hrs  NCAT WDWN WF NAC Lungs: CTA CV: RRR ABD: soft, minimal guarding in bilateral lower quadrants, no rebound, Pos BS No CVAT EXT: neg c/c/e Neuro: non focal  Skin : intact  Blood cxs neg to date UCS neg GC and Chlamydia neg CT and Sono reviewed [facility] note reviewed IR note reviewed  A: Abdominal pain- TOA by CT and Sono. No history of PID. GC /Chlam neg. No risk factors. Presentation as RLQ pain with low grade fever and leukocytosis most  C/w appendiceal etx. GSURG notes nl appendix on CT per independent review. Clinically improved on IV abx, Tolerating PO , ambulating and afebrile x 36hrs.. Leukocytosis resolved  P: Continue IV ABX Recommend DC tomorrow if clinical situation remains stable. Will dc home on Levaquin and Flagyl x 14 d Fu sono in office next week recommended

## 2019-06-08 NOTE — Progress Notes (Signed)
PROGRESS NOTE  Lodie H [place] OTL:[id number] DOB: 10-26-1971 DOA: 06/06/2019 PCP: Patient, [name]   LOS: 2 days   Brief narrative: This is a 48 year old female with past medical history of Gilbert's syndrome, ovarian cyst, ovarian cyst, C-section x2, endometrial ablation, abdominoplasty. She presented to the ED on 6/18 with 1 day history of excruciating generalized abdominal pain associated with nausea, vomiting and low-grade fever.  ED Course:T-max 100.3 F, tachycardic, blood pressure soft. White count 97.4 with neutrophilic predominance. Lactic acid normal. T bili 2.8, remainder of LFTs normal. Lipase normal. UA with positive nitrite, 11-20 WBCs, and many bacteria. Urine pregnancy test negative. Wet prep showing many WBCs. GC chlamydia probe pending. COVID-19 rapid test negative. Blood culture x2 sent. CT abdomen pelvis showed findings concerning for pelvic inflammatory disease with development of tubal ovarian abscesses. Pelvic ultrasound without evidence of ovarian torsion.   Patient was admitted on hospice service.  Gyn team was consulted.  Subjective: Patient was seen and examined this morning.  Pleasant middle-aged Caucasian female.  Lying down in bed.  Not in distress.  Pain improved.  Nausea vomiting improved. Complains of 3 episodes of watery bowel movement since last night. Chart reviewed. T-max 99.2, blood pressure on the lower side of normal. WBC count down to 8.3  Assessment/Plan:  Principal Problem:   Sepsis ([place]) Active Problems:   TOA (tubo-ovarian abscess)   PID (acute pelvic inflammatory disease)   UTI (urinary tract infection)   Gilbert syndrome  Sepsis secondary to PID and tubo-ovarian abscesses -CT scan finding as above.  Met sepsis criteria on admission. -Currently on IV Zosyn and IV doxycycline  -Sepsis parameters improved.   -OB/GYN and general surgery consult appreciated. -IR was consulted this morning for drainage of pelvic abscess.   Recommended continuation of IV -Pain medicine as needed.  She has not required any in last 24 hours. -Blood culture NGTD  UTI -Urinalysis positive for nitrite.  Urine culture did not show any growth.  Already on antibiotics for PID.  Acute diarrhea -3 episodes of loose watery movement since last night.  May be a side effect of doxycycline.  Also need to rule out C. difficile.  Send stool sample for C. difficile assay.  History ofGilbertsyndrome -T bili 2.8, remainder of LFTs normal.No prior LFTs in the chart for comparison. CT with evidence of cholelithiasis. -Right upper quadrant ultrasound  Mobility: Encourage ambulation Diet: Regular diet DVT prophylaxis: Lovenox Code Status:  Code Status: Full Code  Family Communication: Expected Discharge: Remains inpatient for IV antibiotics.  Consultants:  General surgery, IR, GYN  Procedures:  None   Antimicrobials:  Anti-infectives (From admission, onward)   Start     Dose/Rate Route Frequency Ordered Stop   06/07/19 0000  doxycycline (VIBRAMYCIN) 100 mg in sodium chloride 0.9 % 250 mL IVPB     100 mg 125 mL/hr over 120 Minutes Intravenous Every 12 hours 06/06/19 2302     06/06/19 2200  piperacillin-tazobactam (ZOSYN) IVPB 3.375 g     3.375 g 12.5 mL/hr over 240 Minutes Intravenous Every 8 hours 06/06/19 2100     06/06/19 1715  piperacillin-tazobactam (ZOSYN) IVPB 3.375 g     3.375 g 100 mL/hr over 30 Minutes Intravenous  Once 06/06/19 1706 06/06/19 1810   06/06/19 1500  cefTRIAXone (ROCEPHIN) 1 g in sodium chloride 0.9 % 100 mL IVPB     1 g 200 mL/hr over 30 Minutes Intravenous  Once 06/06/19 1452 06/06/19 1607      Infusions:  .  doxycycline (VIBRAMYCIN) IV 100 mg (06/08/19 0046)  . piperacillin-tazobactam (ZOSYN)  IV 3.375 g (06/08/19 5809)    Scheduled Meds: . enoxaparin (LOVENOX) injection  40 mg Subcutaneous Q24H    PRN meds: acetaminophen **OR** acetaminophen, morphine injection   Objective:  Vitals:   06/08/19 0428 06/08/19 0844  BP: 110/75 103/79  Pulse:  93  Resp: 15 17  Temp: 98.4 F (36.9 C) 98.2 F (36.8 C)  SpO2: 98% 98%    Intake/Output Summary (Last 24 hours) at 06/08/2019 1019 Last data filed at 06/08/2019 0939 Gross per 24 hour  Intake 2036.54 ml  Output 2601 ml  Net -564.46 ml   Filed Weights   06/06/19 1138 06/06/19 2004  Weight: 62.1 kg 62.2 kg   Weight change:  Body mass index is 22.13 kg/m.   Physical Exam: General exam: Appears calm and comfortable.  Skin: No rashes, lesions or ulcers. HEENT: Atraumatic, normocephalic, supple neck, no obvious bleeding Lungs: Clear to auscultation bilaterally CVS: Regular rate and rhythm, no murmur GI/Abd soft, minimal tenderness in the lower abdomen, nondistended, bowel sound present CNS: Alert, awake, oriented x3 Psychiatry: Mood appropriate Extremities: No pedal edema, no calf tenderness  Data Review: I have personally reviewed the laboratory data and studies available.  Recent Labs  Lab 06/06/19 1301 06/07/19 0359 06/08/19 0316  WBC 14.3* 11.2* 8.3  NEUTROABS 12.6* 9.2* 5.8  HGB 14.3 12.3 11.8*  HCT 42.9 36.6 34.7*  MCV 87.2 87.8 87.6  PLT 190 158 150   Recent Labs  Lab 06/06/19 1301 06/07/19 0359 06/08/19 0316  NA 136 137 139  K 3.9 3.5 3.4*  CL 99 106 107  CO2 _0 GLUCOSE 123* 86 88  BUN _1 CREATININE 0.69 0.83 0.78  CALCIUM 9.2 8.1* 8.3*    Terrilee Croak, MD  [facility] 06/08/2019

## 2019-06-09 MED ORDER — LEVOFLOXACIN 750 MG PO TABS: 750.0000 mg | ORAL_TABLET | Freq: Every day | ORAL | 0 refills | Status: AC

## 2019-06-09 MED ORDER — METRONIDAZOLE 500 MG PO TABS: 500.0000 mg | ORAL_TABLET | Freq: Three times a day (TID) | ORAL | 0 refills | Status: AC

## 2019-06-09 NOTE — Discharge Summary (Signed)
Physician Discharge Summary  [name] [name] [place] QVZ:[id number] DOB: Aug 09, 1971 DOA: 06/06/2019  PCP: Patient, [name]  Admit date: 06/06/2019 Discharge date: 06/09/2019  Admitted From: Home Discharge disposition: Home   Code Status: Full Code   Recommendations for Outpatient Follow-Up:   1. Follow-up with GYN as an outpatient.  Discharge Diagnosis:   Principal Problem:   Sepsis ([place]) Active Problems:   TOA (tubo-ovarian abscess)   PID (acute pelvic inflammatory disease)   UTI (urinary tract infection)   [name] syndrome    History of Present Illness / Brief narrative:  This is a 48 year old female with past medical history of Gilbert's syndrome, ovarian cyst, ovarian cyst, C-section x2, endometrial ablation, abdominoplasty. She presented to the ED on 6/18 with 1 day history of excruciating generalized abdominal pain associated with nausea, vomiting and low-grade fever.  ED Course:T-max 100.3 F, tachycardic, blood pressure soft. White count 32.9 with neutrophilic predominance. Lactic acid normal. T bili 2.8, remainder of LFTs normal. Lipase normal. UA with positive nitrite, 11-20 WBCs, and many bacteria. Urine pregnancy test negative. Wet prep showing many WBCs. GC chlamydia probe pending. COVID-19 rapid test negative. Blood culture x2 sent. CT abdomen pelvis showed findings concerning for pelvic inflammatory disease with development of tubal ovarian abscesses. Pelvic ultrasound without evidence of ovarian torsion.   Patient was admitted on hospitalist service.  Gyn team was consulted.  Hospital Course:  Sepsis secondary to PID and tubo-ovarian abscesses -CT scan finding as above.  Met sepsis criteria on admission. -Treated with broad-spectrum antibiotics IV Zosyn and IV doxycycline  -Sepsis parameters improved.   -OB/GYN and general surgery consult appreciated. -IR was consulted for drainage of pelvic abscess, but it was not required. Recommended continuation  of antibiotics -Not to any pain medicines. -Blood culture NGTD -GYN recommends to discharge home on Levaquin and Flagyl for 2 weeks.  Follow-up with GI as an outpatient for ultrasound.  UTI -Urinalysis positive for nitrite.  Urine culture did not show any growth.  Already on antibiotics for PID.  Acute diarrhea - on 6/20, patient complained of 3 episodes of loose watery movement overnight.  May be a side effect of doxycycline. Diarrhea slowed down with Lomotil.  No recurrence of fever, WBC count normal.  History ofGilbertsyndrome -T bili 2.8, remainder of LFTs normal.No prior LFTs in the chart for comparison. CT with evidence of cholelithiasis. -Right upper quadrant ultrasound  Stable for discharge home today.   Subjective:  Patient was seen and examined this morning.  Walking around in room.  Pain controlled.  Ready to go home.  Discharge Exam:   Vitals:   06/08/19 0844 06/08/19 2108 06/09/19 0018 06/09/19 0547  BP: 103/79 116/82 105/78 101/69  Pulse: 93 89 72 72  Resp: 17 16 16 16   Temp: 98.2 F (36.8 C) 99.4 F (37.4 C) 98.4 F (36.9 C) 98.6 F (37 C)  TempSrc: Oral Oral Oral Oral  SpO2: 98% 100% 98% 99%  Weight:      Height:        Body mass index is 22.13 kg/m.  General exam: Appears calm and comfortable.  Skin: No rashes, lesions or ulcers. HEENT: Atraumatic, normocephalic, supple neck, no obvious bleeding Lungs: Clear to auscultation bilaterally CVS: Regular rate and rhythm, no murmur GI/Abd soft, nontender, nondistended, bowel sound present CNS: Alert, awake, oriented x3 Psychiatry: Mood appropriate Extremities: No pedal edema, no calf tenderness  Discharge Instructions:  Wound care: None Discharge Instructions    Diet - low sodium heart healthy  Complete by: As directed    Increase activity slowly   Complete by: As directed       Allergies as of 06/09/2019   No Known Allergies     Medication List    STOP taking these medications     traMADol 50 MG tablet Commonly known as: Ultram     TAKE these medications   levofloxacin 750 MG tablet Commonly known as: Levaquin Take 1 tablet (750 mg total) by mouth daily for 14 days.   metroNIDAZOLE 500 MG tablet Commonly known as: Flagyl Take 1 tablet (500 mg total) by mouth 3 (three) times daily for 14 days.   naproxen sodium 220 MG tablet Commonly known as: ALEVE Take 220 mg by mouth 2 (two) times daily as needed (pain/headache).       Time coordinating discharge: 35 minutes  The results of significant diagnostics from this hospitalization (including imaging, microbiology, ancillary and laboratory) are listed below for reference.    Procedures and Diagnostic Studies:   US Transvaginal Non-ob  Result Date: 06/06/2019 CLINICAL DATA:  Sudden onset of abdominal pain. EXAM: TRANSABDOMINAL AND TRANSVAGINAL ULTRASOUND OF PELVIS DOPPLER ULTRASOUND OF OVARIES TECHNIQUE: Both transabdominal and transvaginal ultrasound examinations of the pelvis were performed. Transabdominal technique was performed for global imaging of the pelvis including uterus, ovaries, adnexal regions, and pelvic cul-de-sac. It was necessary to proceed with endovaginal exam following the transabdominal exam to visualize the ovaries. Color and duplex Doppler ultrasound was utilized to evaluate blood flow to the ovaries. COMPARISON:  None. FINDINGS: Uterus Measurements: 7.5 x 3.9 x 4.2 cm = volume: 63.7 mL. The uterus is heterogeneous. There is evidence of a prior Caesarean section scar which limits evaluation. Endometrium Thickness: 8.  No focal abnormality visualized. Right ovary Measurements: 5.5 x 3.6 x 3.8 cm = volume: 39 mL. There is a complex 3.4 x 2.2 x 3.2 cm cystic structure involving the right ovary. This structure contains a thin internal septation. In the region of the right adnexa there is a tubular structure with filled with complex fluid. Left ovary Measurements: 6.3 x 4.2 x 5.8 cm = volume: 82 mL.  There is a 5 x 3.5 x 5.1 cm cystic structure with low level internal echoes. There appears to be an adjacent tubular structure with complex fluid. Pulsed Doppler evaluation of both ovaries demonstrates normal low-resistance arterial and venous waveforms. Other findings There is a small amount of free fluid in the pelvis. IMPRESSION: 1. No sonographic evidence for ovarian torsion. 2. Findings consistent with bilateral tubo-ovarian abscesses. 3. Small amount of free fluid in the pelvis. Electronically Signed   By: Constance Holster M.D.   On: 06/06/2019 19:57   US Pelvis Complete  Result Date: 06/06/2019 CLINICAL DATA:  Sudden onset of abdominal pain. EXAM: TRANSABDOMINAL AND TRANSVAGINAL ULTRASOUND OF PELVIS DOPPLER ULTRASOUND OF OVARIES TECHNIQUE: Both transabdominal and transvaginal ultrasound examinations of the pelvis were performed. Transabdominal technique was performed for global imaging of the pelvis including uterus, ovaries, adnexal regions, and pelvic cul-de-sac. It was necessary to proceed with endovaginal exam following the transabdominal exam to visualize the ovaries. Color and duplex Doppler ultrasound was utilized to evaluate blood flow to the ovaries. COMPARISON:  None. FINDINGS: Uterus Measurements: 7.5 x 3.9 x 4.2 cm = volume: 63.7 mL. The uterus is heterogeneous. There is evidence of a prior Caesarean section scar which limits evaluation. Endometrium Thickness: 8.  No focal abnormality visualized. Right ovary Measurements: 5.5 x 3.6 x 3.8 cm = volume: 39 mL. There is  a complex 3.4 x 2.2 x 3.2 cm cystic structure involving the right ovary. This structure contains a thin internal septation. In the region of the right adnexa there is a tubular structure with filled with complex fluid. Left ovary Measurements: 6.3 x 4.2 x 5.8 cm = volume: 82 mL. There is a 5 x 3.5 x 5.1 cm cystic structure with low level internal echoes. There appears to be an adjacent tubular structure with complex fluid. Pulsed  Doppler evaluation of both ovaries demonstrates normal low-resistance arterial and venous waveforms. Other findings There is a small amount of free fluid in the pelvis. IMPRESSION: 1. No sonographic evidence for ovarian torsion. 2. Findings consistent with bilateral tubo-ovarian abscesses. 3. Small amount of free fluid in the pelvis. Electronically Signed   By: Constance Holster M.D.   On: 06/06/2019 19:57   Ct Abdomen Pelvis W Contrast  Result Date: 06/06/2019 CLINICAL DATA:  Abdominal pain and discomfort. EXAM: CT ABDOMEN AND PELVIS WITH CONTRAST TECHNIQUE: Multidetector CT imaging of the abdomen and pelvis was performed using the standard protocol following bolus administration of intravenous contrast. CONTRAST:  131m OMNIPAQUE IOHEXOL 300 MG/ML  SOLN COMPARISON:  None. FINDINGS: Lower chest: No acute abnormality. Hepatobiliary: The liver is unremarkable. Multiple large gallstones are noted. The gallbladder is moderately distended without evidence of acute cholecystitis. Pancreas: Unremarkable. No pancreatic ductal dilatation or surrounding inflammatory changes. Spleen: Normal in size without focal abnormality. Adrenals/Urinary Tract: Adrenal glands are unremarkable. Kidneys are normal, without renal calculi, focal lesion, or hydronephrosis. Bladder is unremarkable. Stomach/Bowel: There is scattered colonic diverticula without CT evidence of diverticulitis. The appendix is not reliably identified. The stomach is unremarkable. There is no evidence of a small-bowel obstruction. Vascular/Lymphatic: No significant vascular findings are present. No enlarged abdominal or pelvic lymph nodes. Reproductive: There are extensive inflammatory changes in the patient's pelvis. There are 2 rim enhancing collections in the pelvis, with 1 measuring approximately 5 cm, while the other measures approximately 3 cm. There are organizing collections in the pelvis posteriorly measuring up to approximately 7 cm. There is a small  amount of free fluid in the pelvis, likely reactive. Other: No abdominal wall hernia or abnormality. No abdominopelvic ascites. Musculoskeletal: No acute or significant osseous findings. IMPRESSION: 1. Overall findings concerning for pelvic inflammatory disease with development of tubo-ovarian abscesses. 2. The appendix is not reliably identified. While the inflammatory changes in the patient's pelvis are favored to be secondary to PID, acute appendicitis is not excluded in this patient. 3. Cholelithiasis without CT evidence of acute cholecystitis. Electronically Signed   By: CConstance HolsterM.D.   On: 06/06/2019 15:24   UKoreaArt/ven Flow Abd Pelv Doppler  Result Date: 06/06/2019 CLINICAL DATA:  Sudden onset of abdominal pain. EXAM: TRANSABDOMINAL AND TRANSVAGINAL ULTRASOUND OF PELVIS DOPPLER ULTRASOUND OF OVARIES TECHNIQUE: Both transabdominal and transvaginal ultrasound examinations of the pelvis were performed. Transabdominal technique was performed for global imaging of the pelvis including uterus, ovaries, adnexal regions, and pelvic cul-de-sac. It was necessary to proceed with endovaginal exam following the transabdominal exam to visualize the ovaries. Color and duplex Doppler ultrasound was utilized to evaluate blood flow to the ovaries. COMPARISON:  None. FINDINGS: Uterus Measurements: 7.5 x 3.9 x 4.2 cm = volume: 63.7 mL. The uterus is heterogeneous. There is evidence of a prior Caesarean section scar which limits evaluation. Endometrium Thickness: 8.  No focal abnormality visualized. Right ovary Measurements: 5.5 x 3.6 x 3.8 cm = volume: 39 mL. There is a complex 3.4 x 2.2  x 3.2 cm cystic structure involving the right ovary. This structure contains a thin internal septation. In the region of the right adnexa there is a tubular structure with filled with complex fluid. Left ovary Measurements: 6.3 x 4.2 x 5.8 cm = volume: 82 mL. There is a 5 x 3.5 x 5.1 cm cystic structure with low level internal  echoes. There appears to be an adjacent tubular structure with complex fluid. Pulsed Doppler evaluation of both ovaries demonstrates normal low-resistance arterial and venous waveforms. Other findings There is a small amount of free fluid in the pelvis. IMPRESSION: 1. No sonographic evidence for ovarian torsion. 2. Findings consistent with bilateral tubo-ovarian abscesses. 3. Small amount of free fluid in the pelvis. Electronically Signed   By: Constance Holster M.D.   On: 06/06/2019 19:57   US Abdomen Limited Ruq  Result Date: 06/07/2019 CLINICAL DATA:  Elevated bilirubin. EXAM: ULTRASOUND ABDOMEN LIMITED RIGHT UPPER QUADRANT COMPARISON:  CT earlier this day. FINDINGS: Gallbladder: Intraluminal gallstones. Borderline wall thickening of 3 mm. No pericholecystic fluid. No sonographic Murphy sign noted by sonographer. Common bile duct: Diameter: 4 mm, normal. Liver: No focal lesion identified. Within normal limits in parenchymal echogenicity. Portal vein is patent on color Doppler imaging with normal direction of blood flow towards the liver. IMPRESSION: 1. Gallstones with borderline gallbladder wall thickening. Recommend correlation for clinical symptoms of biliary colic/acute cholecystitis. No biliary ductal dilatation. 2. Normal sonographic appearance of the liver. Electronically Signed   By: Keith Rake M.D.   On: 06/07/2019 00:53     Labs:   Basic Metabolic Panel: Recent Labs  Lab 06/06/19 1301 06/07/19 0359 06/08/19 0316  NA 136 137 139  K 3.9 3.5 3.4*  CL 99 106 107  CO2 27 22 25   GLUCOSE 123* 86 88  BUN 11 8 7   CREATININE 0.69 0.83 0.78  CALCIUM 9.2 8.1* 8.3*   GFR Estimated Creatinine Clearance: 82.3 mL/min (by C-G formula based on SCr of 0.78 mg/dL). Liver Function Tests: Recent Labs  Lab 06/06/19 1301 06/07/19 0359  AST 17 14*  ALT 14 10  ALKPHOS 51 45  BILITOT 2.8* 3.3*  PROT 7.6 5.9*  ALBUMIN 4.3 3.2*   Recent Labs  Lab 06/06/19 1301  LIPASE 33   No  results for input(s): AMMONIA in the last 168 hours. Coagulation profile No results for input(s): INR, PROTIME in the last 168 hours.  CBC: Recent Labs  Lab 06/06/19 1301 06/07/19 0359 06/08/19 0316  WBC 14.3* 11.2* 8.3  NEUTROABS 12.6* 9.2* 5.8  HGB 14.3 12.3 11.8*  HCT 42.9 36.6 34.7*  MCV 87.2 87.8 87.6  PLT 190 158 150   Cardiac Enzymes: No results for input(s): CKTOTAL, CKMB, CKMBINDEX, TROPONINI in the last 168 hours. BNP: Invalid input(s): POCBNP CBG: No results for input(s): GLUCAP in the last 168 hours. D-Dimer No results for input(s): DDIMER in the last 72 hours. Hgb A1c No results for input(s): HGBA1C in the last 72 hours. Lipid Profile No results for input(s): CHOL, HDL, LDLCALC, TRIG, CHOLHDL, LDLDIRECT in the last 72 hours. Thyroid function studies No results for input(s): TSH, T4TOTAL, T3FREE, THYROIDAB in the last 72 hours.  Invalid input(s): FREET3 Anemia work up No results for input(s): VITAMINB12, FOLATE, FERRITIN, TIBC, IRON, RETICCTPCT in the last 72 hours. Microbiology Recent Results (from the past 240 hour(s))  Urine culture     Status: None   Collection Time: 06/06/19  1:01 PM   Specimen: Urine, Random  Result Value Ref Range Status  Specimen Description   Final    URINE, RANDOM Performed at [facility], [address] [place], [place] [address]    Special Requests   Final    NONE Performed at [facility], [address] [place], [place] [address]    Culture   Final    NO GROWTH Performed at [facility], [place] [address] [place],  [address]    Report Status 06/08/2019 FINAL  Final  Wet prep, genital     Status: Abnormal   Collection Time: 06/06/19  4:06 PM   Specimen: Cervical/Vaginal swab  Result Value Ref Range Status   Yeast Wet Prep HPF POC NONE SEEN NONE SEEN Final   Trich, Wet Prep NONE SEEN NONE SEEN Final   Clue Cells Wet Prep HPF POC NONE SEEN NONE SEEN Final   WBC, Wet Prep HPF POC  MANY (A) NONE SEEN Final   Sperm NONE SEEN  Final    Comment: Performed at [facility], [address] [place], [place] [address]  SARS Coronavirus 2 (Hosp order,Performed in [place] lab via [employer] ID)     Status: None   Collection Time: 06/06/19  5:31 PM   Specimen: Dry Nasal Swab ([employer] ID Now)  Result Value Ref Range Status   SARS Coronavirus 2 ([employer] ID Now) NEGATIVE NEGATIVE Final    Comment: (NOTE) Interpretive Result Comment(s): COVID 19 Positive SARS CoV 2 target nucleic acids are DETECTED. The SARS CoV 2 RNA is generally detectable in upper and lower respiratory specimens during the acute phase of infection.  Positive results are indicative of active infection with SARS CoV 2.  Clinical correlation with patient history and other diagnostic information is necessary to determine patient infection status.  Positive results do not rule out bacterial infection or coinfection with other viruses. The expected result is Negative. COVID 19 Negative SARS CoV 2 target nucleic acids are NOT DETECTED. The SARS CoV 2 RNA is generally detectable in upper and lower respiratory specimens during the acute phase of infection.  Negative results do not preclude SARS CoV 2 infection, do not rule out coinfections with other pathogens, and should not be used as the sole basis for treatment or other patient management decisions.  Negative results must be combined with clinical  observations, patient history, and epidemiological information. The expected result is Negative. Invalid Presence or absence of SARS CoV 2 nucleic acids cannot be determined. Repeat testing was performed on the submitted specimen and repeated Invalid results were obtained.  If clinically indicated, additional testing on a new specimen with an alternate test methodology [phone]) is advised.  The SARS CoV 2 RNA is generally detectable in upper and lower respiratory specimens during the acute phase  of infection. The expected result is Negative. Fact Sheet for Patients:  GolfingFamily.no Fact Sheet for Healthcare Providers: https://www.hernandez-brewer.com/ This test is not yet approved or cleared by the [place] FDA and has been authorized for detection and/or diagnosis of SARS CoV 2 by FDA under an Emergency Use Authorization (EUA).  This EUA will remain in effect (meaning this test can be used) for the duration of the COVID19 d eclaration under Section 564(b)(1) of the Act, 21 U.S.C. section [phone] 3(b)(1), unless the authorization is terminated or revoked sooner. Performed at [facility], [address] [place], [place] [address]   Blood culture (routine x 2)     Status: None (Preliminary result)  Collection Time: 06/06/19  6:27 PM   Specimen: Right Antecubital; Blood  Result Value Ref Range Status   Specimen Description   Final    RIGHT ANTECUBITAL Performed at [facility], [address] [place], [place] [address]    Special Requests   Final    BOTTLES DRAWN AEROBIC AND ANAEROBIC Blood Culture adequate volume Performed at [facility], [address] [place], [place] [address]    Culture   Final    NO GROWTH 2 DAYS Performed at [facility], [place] [address] [place], [place] [address]    Report Status PENDING  Incomplete  Blood culture (routine x 2)     Status: None (Preliminary result)   Collection Time: 06/06/19  7:20 PM   Specimen: BLOOD RIGHT ARM  Result Value Ref Range Status   Specimen Description   Final    BLOOD RIGHT ARM Performed at [facility], [address] [place], [place] [address]    Special Requests   Final    BOTTLES DRAWN AEROBIC AND ANAEROBIC Blood Culture adequate volume Performed at [facility], [address] [place], [place] [address]    Culture   Final    NO GROWTH 2 DAYS Performed at [facility], [place] [address]  [place],  [address]    Report Status PENDING  Incomplete    Signed: Marlowe Aschoff Laranda Burkemper  [facility] 06/09/2019, 9:44 AM

## 2019-06-09 NOTE — Progress Notes (Signed)
Order received to discharge patient.  Telemetry monitor applied and CCMD notified.  PIV access removed.  Discharge instructions, follow up, medications and instructions for their use discussed with patient.

## 2019-06-09 NOTE — Progress Notes (Signed)
Patient ID: [name] [name] [place], female   DOB: 06-Mar-1971, 48 y.o.   MRN: [id number] HD #4 Presumptive TOA on IV Zosyn and Doxy  S: Continues to improve No pain meds needed  Able to ambulate in hall without assistance No chills, No CP or SOB O: BP 101/69 (BP Location: Left Arm)   Pulse 72   Temp 98.6 F (37 C) (Oral)   Resp 16   Ht 5' 6"  (1.676 m)   Wt 62.2 kg   SpO2 99%   BMI 22.13 kg/m    CBC    Component Value Date/Time   WBC 8.3 06/08/2019 0316   RBC 3.96 06/08/2019 0316   HGB 11.8 (L) 06/08/2019 0316   HCT 34.7 (L) 06/08/2019 0316   PLT 150 06/08/2019 0316   MCV 87.6 06/08/2019 0316   MCH 29.8 06/08/2019 0316   MCHC 34.0 06/08/2019 0316   RDW 11.6 06/08/2019 0316   LYMPHSABS 1.8 06/08/2019 0316   MONOABS 0.5 06/08/2019 0316   EOSABS 0.1 06/08/2019 0316   BASOSABS 0.0 06/08/2019 0316    Afebrile x 60hrs  NCAT WDWN WF NAC Lungs: CTA CV: RRR ABD: soft, minimal guarding in bilateral lower quadrants, no rebound, Pos BS No CVAT EXT: neg c/c/e Neuro: non focal  Skin : intact  Blood cxs neg to date UCS neg GC and Chlamydia neg CT and Sono reviewed [facility]UR[facility] note reviewed IR note reviewed  A: Abdominal pain- TOA by CT and Sono. No history of PID. GC /Chlam neg. No risk factors. Presentation as RLQ pain with low grade fever and leukocytosis most  C/w appendiceal etx. GSURG notes nl appendix on CT per independent review. Clinically improved on IV abx, Tolerating PO , ambulating and afebrile x 60hrs.. Leukocytosis resolved  P: DC home. Will dc home on Levaquin and Flagyl x 14 d Fu sono in office this week recommended

## 2019-06-11 LAB — CULTURE, BLOOD (ROUTINE X 2)
Culture: NO GROWTH
Culture: NO GROWTH
Special Requests: ADEQUATE
Special Requests: ADEQUATE

## 2019-06-14 ENCOUNTER — Other Ambulatory Visit: Payer: Self-pay | Admitting: Obstetrics and Gynecology

## 2019-06-14 ENCOUNTER — Other Ambulatory Visit (HOSPITAL_COMMUNITY): Payer: Self-pay | Admitting: Obstetrics and Gynecology

## 2019-06-14 DIAGNOSIS — N7093 Salpingitis and oophoritis, unspecified: Secondary | ICD-10-CM

## 2019-06-19 ENCOUNTER — Other Ambulatory Visit: Payer: Self-pay

## 2019-06-19 ENCOUNTER — Ambulatory Visit (HOSPITAL_COMMUNITY)
Admission: RE | Admit: 2019-06-19 | Discharge: 2019-06-19 | Disposition: A | Discharge status: Home / Self Care | Payer: 59 | Source: Ambulatory Visit | Attending: Obstetrics and Gynecology | Admitting: Obstetrics and Gynecology

## 2019-06-19 DIAGNOSIS — N7093 Salpingitis and oophoritis, unspecified: Secondary | ICD-10-CM

## 2019-06-19 MED ORDER — IOHEXOL 300 MG/ML  SOLN
100.0000 mL | Freq: Once | INTRAMUSCULAR | Status: AC | PRN
  Administered 2019-06-19: 17:00:00 100 mL via INTRAVENOUS

## 2019-06-26 ENCOUNTER — Other Ambulatory Visit (HOSPITAL_COMMUNITY): Payer: Self-pay | Admitting: Obstetrics and Gynecology

## 2019-06-26 ENCOUNTER — Other Ambulatory Visit: Payer: Self-pay | Admitting: Obstetrics and Gynecology

## 2019-06-26 DIAGNOSIS — N7093 Salpingitis and oophoritis, unspecified: Secondary | ICD-10-CM

## 2019-07-03 ENCOUNTER — Ambulatory Visit (HOSPITAL_COMMUNITY)
Admission: RE | Admit: 2019-07-03 | Discharge: 2019-07-03 | Disposition: A | Discharge status: Home / Self Care | Payer: 59 | Source: Ambulatory Visit | Attending: Obstetrics and Gynecology | Admitting: Obstetrics and Gynecology

## 2019-07-03 ENCOUNTER — Other Ambulatory Visit: Payer: Self-pay

## 2019-07-03 DIAGNOSIS — N7093 Salpingitis and oophoritis, unspecified: Secondary | ICD-10-CM | POA: Diagnosis not present

## 2019-07-03 MED ORDER — IOHEXOL 300 MG/ML  SOLN
100.0000 mL | Freq: Once | INTRAMUSCULAR | Status: AC | PRN
  Administered 2019-07-03: 100 mL via INTRAVENOUS

## 2019-07-29 ENCOUNTER — Other Ambulatory Visit: Payer: Self-pay | Admitting: Obstetrics and Gynecology

## 2019-07-29 DIAGNOSIS — N7093 Salpingitis and oophoritis, unspecified: Secondary | ICD-10-CM

## 2019-09-05 ENCOUNTER — Other Ambulatory Visit: Payer: Self-pay | Admitting: Obstetrics and Gynecology

## 2019-09-06 ENCOUNTER — Other Ambulatory Visit: Payer: 59

## 2019-10-04 ENCOUNTER — Ambulatory Visit
Admission: RE | Admit: 2019-10-04 | Discharge: 2019-10-04 | Disposition: A | Discharge status: Home / Self Care | Payer: 59 | Source: Ambulatory Visit | Attending: Obstetrics and Gynecology | Admitting: Obstetrics and Gynecology

## 2019-10-04 DIAGNOSIS — N7093 Salpingitis and oophoritis, unspecified: Secondary | ICD-10-CM

## 2019-10-04 MED ORDER — GADOBENATE DIMEGLUMINE 529 MG/ML IV SOLN
12.0000 mL | Freq: Once | INTRAVENOUS | Status: AC | PRN
  Administered 2019-10-04: 12 mL via INTRAVENOUS

## 2020-01-23 ENCOUNTER — Other Ambulatory Visit: Payer: Self-pay | Admitting: Obstetrics and Gynecology

## 2020-01-23 DIAGNOSIS — N6310 Unspecified lump in the right breast, unspecified quadrant: Secondary | ICD-10-CM

## 2020-02-08 ENCOUNTER — Other Ambulatory Visit: Payer: Self-pay | Admitting: Obstetrics and Gynecology

## 2020-02-08 DIAGNOSIS — E785 Hyperlipidemia, unspecified: Secondary | ICD-10-CM

## 2020-02-21 ENCOUNTER — Other Ambulatory Visit: Payer: Self-pay | Admitting: Surgery

## 2020-02-21 DIAGNOSIS — N6001 Solitary cyst of right breast: Secondary | ICD-10-CM

## 2020-02-26 ENCOUNTER — Other Ambulatory Visit: Payer: Self-pay | Admitting: Surgery

## 2020-02-26 DIAGNOSIS — N6001 Solitary cyst of right breast: Secondary | ICD-10-CM

## 2020-02-28 ENCOUNTER — Other Ambulatory Visit: Payer: 59

## 2020-03-12 ENCOUNTER — Ambulatory Visit
Admission: RE | Admit: 2020-03-12 | Discharge: 2020-03-12 | Disposition: A | Discharge status: Home / Self Care | Payer: No Typology Code available for payment source | Source: Ambulatory Visit | Attending: Obstetrics and Gynecology | Admitting: Obstetrics and Gynecology

## 2020-03-12 DIAGNOSIS — E785 Hyperlipidemia, unspecified: Secondary | ICD-10-CM

## 2020-03-13 ENCOUNTER — Other Ambulatory Visit: Payer: Self-pay | Admitting: Surgery

## 2020-03-13 ENCOUNTER — Ambulatory Visit
Admission: RE | Admit: 2020-03-13 | Discharge: 2020-03-13 | Disposition: A | Discharge status: Home / Self Care | Payer: 59 | Source: Ambulatory Visit | Attending: Surgery | Admitting: Surgery

## 2020-03-13 ENCOUNTER — Ambulatory Visit
Admission: RE | Admit: 2020-03-13 | Discharge: 2020-03-13 | Disposition: A | Discharge status: Home / Self Care | Payer: No Typology Code available for payment source | Source: Ambulatory Visit | Attending: Surgery | Admitting: Surgery

## 2020-03-13 ENCOUNTER — Other Ambulatory Visit: Payer: Self-pay

## 2020-03-13 DIAGNOSIS — N6001 Solitary cyst of right breast: Secondary | ICD-10-CM

## 2020-11-28 ENCOUNTER — Other Ambulatory Visit: Payer: Self-pay

## 2020-11-28 ENCOUNTER — Emergency Department (HOSPITAL_COMMUNITY)
Admission: EM | Admit: 2020-11-28 | Discharge: 2020-11-28 | Disposition: A | Discharge status: Home / Self Care | Payer: No Typology Code available for payment source | Attending: Emergency Medicine | Admitting: Emergency Medicine

## 2020-11-28 ENCOUNTER — Emergency Department (HOSPITAL_COMMUNITY): Payer: No Typology Code available for payment source

## 2020-11-28 ENCOUNTER — Encounter (HOSPITAL_COMMUNITY): Payer: Self-pay | Admitting: Emergency Medicine

## 2020-11-28 DIAGNOSIS — R079 Chest pain, unspecified: Secondary | ICD-10-CM | POA: Insufficient documentation

## 2020-11-28 DIAGNOSIS — Z5321 Procedure and treatment not carried out due to patient leaving prior to being seen by health care provider: Secondary | ICD-10-CM | POA: Insufficient documentation

## 2020-11-28 DIAGNOSIS — M549 Dorsalgia, unspecified: Secondary | ICD-10-CM | POA: Insufficient documentation

## 2020-11-28 DIAGNOSIS — R0602 Shortness of breath: Secondary | ICD-10-CM | POA: Insufficient documentation

## 2020-11-28 DIAGNOSIS — R110 Nausea: Secondary | ICD-10-CM | POA: Insufficient documentation

## 2020-11-28 LAB — CBC
HCT: 45.7 % (ref 36.0–46.0)
Hemoglobin: 16.1 g/dL — ABNORMAL HIGH (ref 12.0–15.0)
MCH: 30.3 pg (ref 26.0–34.0)
MCHC: 35.2 g/dL (ref 30.0–36.0)
MCV: 86.1 fL (ref 80.0–100.0)
Platelets: 196 10*3/uL (ref 150–400)
RBC: 5.31 MIL/uL — ABNORMAL HIGH (ref 3.87–5.11)
RDW: 11.8 % (ref 11.5–15.5)
WBC: 6.9 10*3/uL (ref 4.0–10.5)
nRBC: 0 % (ref 0.0–0.2)

## 2020-11-28 LAB — BASIC METABOLIC PANEL
Anion gap: 16 — ABNORMAL HIGH (ref 5–15)
BUN: 16 mg/dL (ref 6–20)
CO2: 23 mmol/L (ref 22–32)
Calcium: 9.7 mg/dL (ref 8.9–10.3)
Chloride: 100 mmol/L (ref 98–111)
Creatinine, Ser: 0.94 mg/dL (ref 0.44–1.00)
GFR, Estimated: >60 mL/min (ref >60)
Glucose, Bld: 144 mg/dL — ABNORMAL HIGH (ref 70–99)
Potassium: 3.9 mmol/L (ref 3.5–5.1)
Sodium: 139 mmol/L (ref 135–145)

## 2020-11-28 LAB — I-STAT BETA HCG BLOOD, ED (MC, WL, AP ONLY): I-stat hCG, quantitative: <5 m[IU]/mL (ref <5)

## 2020-11-28 LAB — TROPONIN I (HIGH SENSITIVITY): Troponin I (High Sensitivity): <2 ng/L (ref <18)

## 2020-11-28 LAB — PROTIME-INR
INR: 0.9 (ref 0.8–1.2)
Prothrombin Time: 12.1 s (ref 11.4–15.2)

## 2020-11-28 NOTE — ED Notes (Signed)
Called pt x3 for vitals, no response.

## 2020-11-28 NOTE — ED Triage Notes (Signed)
Patient reports central chest pain radiating to mid back this morning with SOB and mild nausea , no cough or fever .

## 2020-11-28 NOTE — ED Notes (Signed)
Patient called for lab redraw, no response.

## 2021-03-28 IMAGING — CT CT ABDOMEN AND PELVIS WITH CONTRAST
2 of 5 series · 15 of 46 positions shown, 17 images · IV contrast (omnipaque)
Comparison: CT the abdomen and pelvis 06/06/2019.

CLINICAL DATA: 47-year-old female with history of tubo-ovarian
abscess.

EXAM:
CT ABDOMEN AND PELVIS WITH CONTRAST
TECHNIQUE: Multidetector CT imaging of the abdomen and pelvis was performed
using the standard protocol following bolus administration of
intravenous contrast.
CONTRAST:  100mL OMNIPAQUE IOHEXOL 300 MG/ML  SOLN

[Series 3: a/p w/ 5mm · axial · 0.71mm/px · z∈[+770,+1150]mm · 12 of 86 slices shown, 14 images]
[im 5/86  soft-tissue]
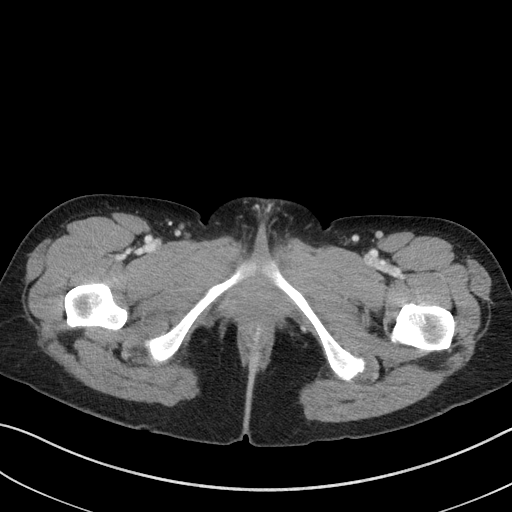
[im 5/86  bone]
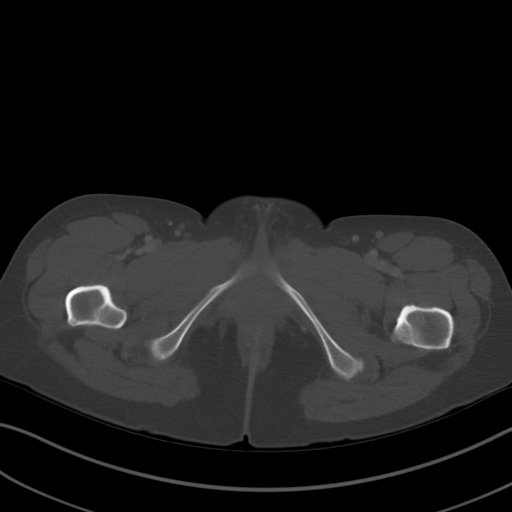
[im 13/86  soft-tissue]
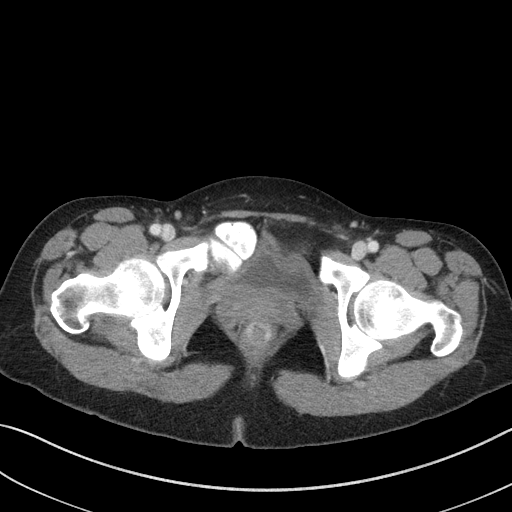
[im 18/86  soft-tissue]
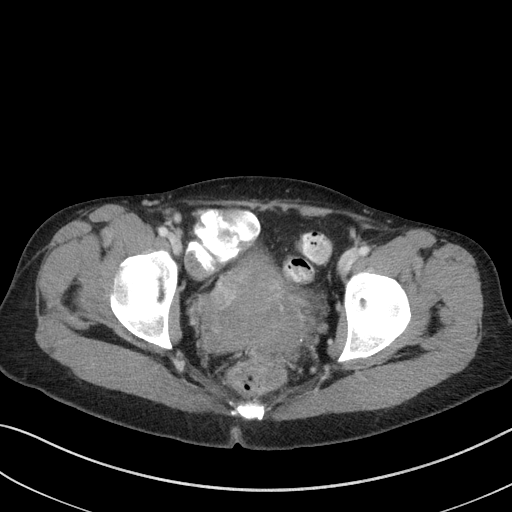
[im 26/86  soft-tissue]
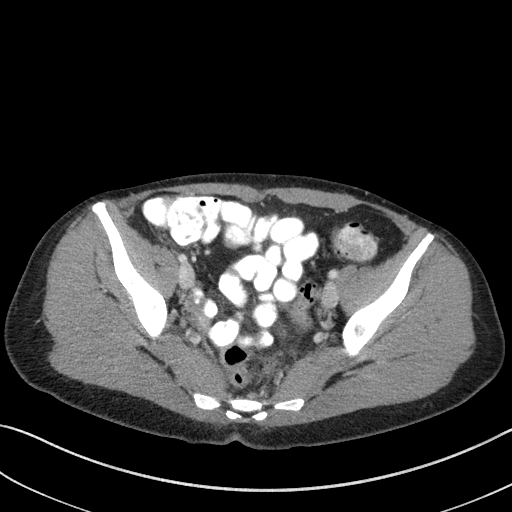
[im 35/86  soft-tissue]
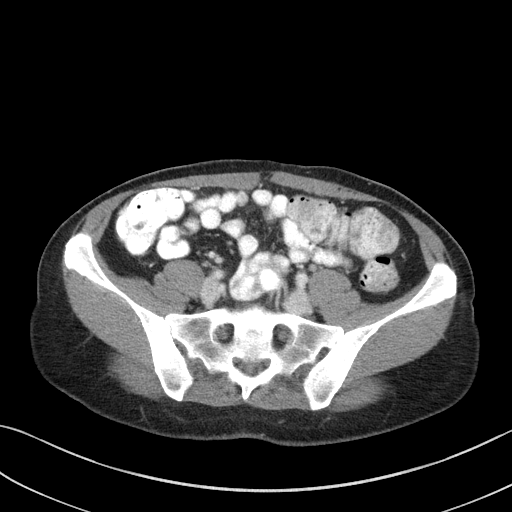
[im 39/86  soft-tissue]
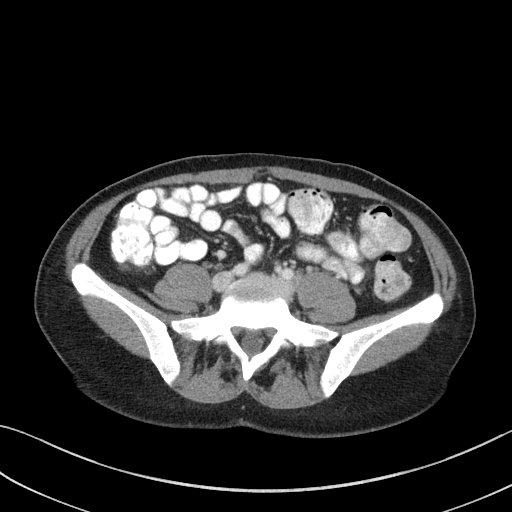
[im 47/86  soft-tissue]
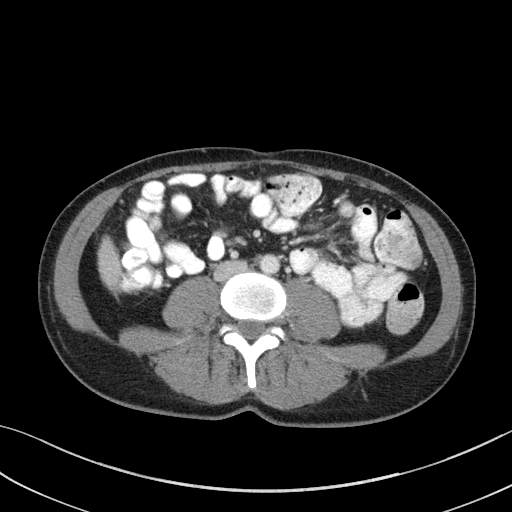
[im 52/86  soft-tissue]
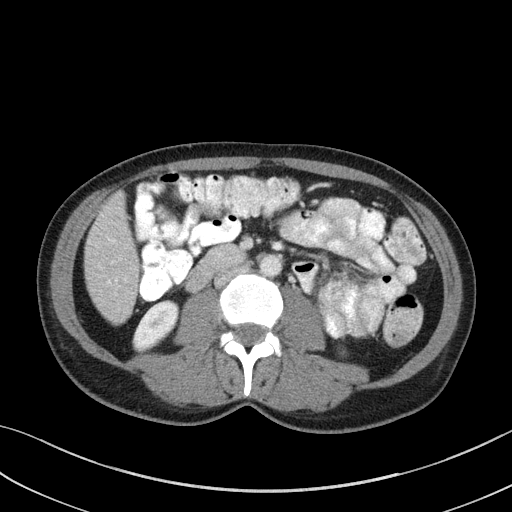
[im 60/86  soft-tissue]
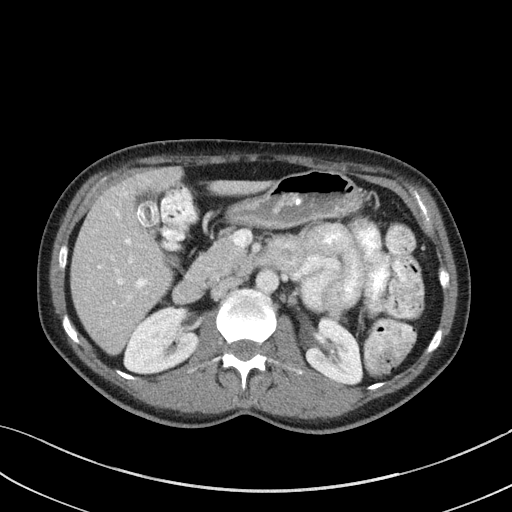
[im 60/86  bone]
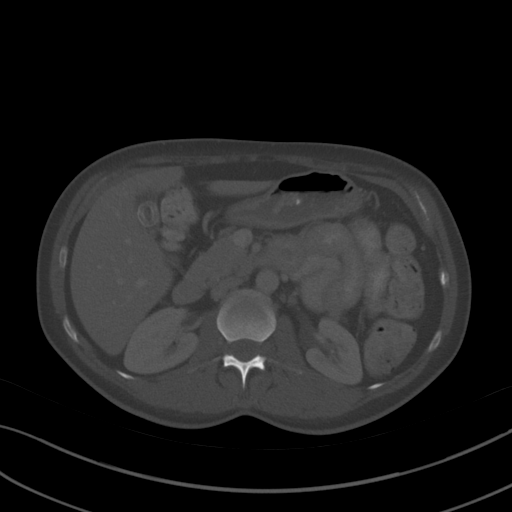
[im 69/86  soft-tissue]
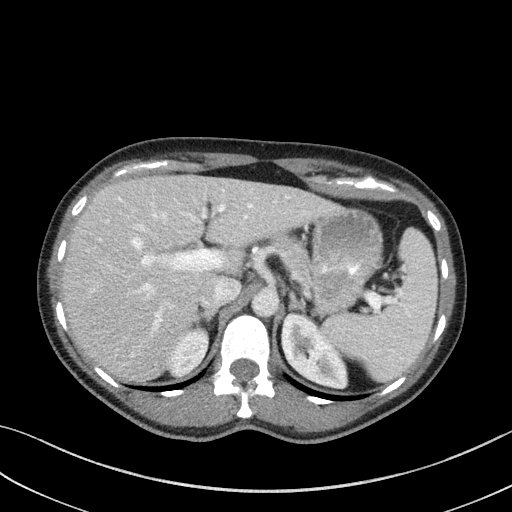
[im 73/86  soft-tissue]
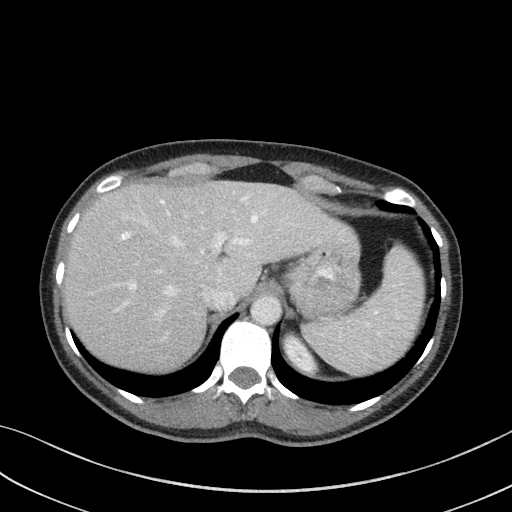
[im 81/86  soft-tissue]
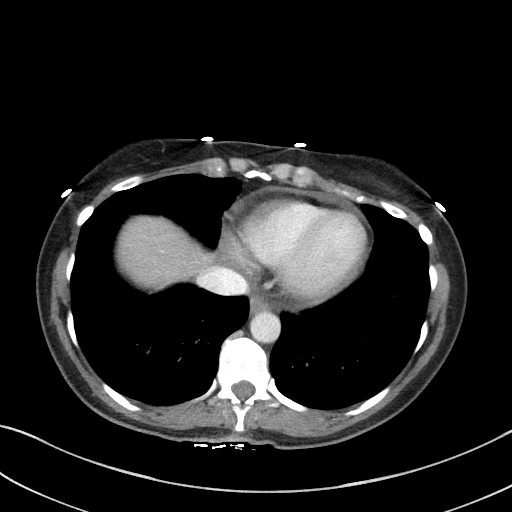

[Series 6: a/p w/ cor · coronal · 0.68mm/px · 3 of 120 slices shown]
[im 40/120  soft-tissue]
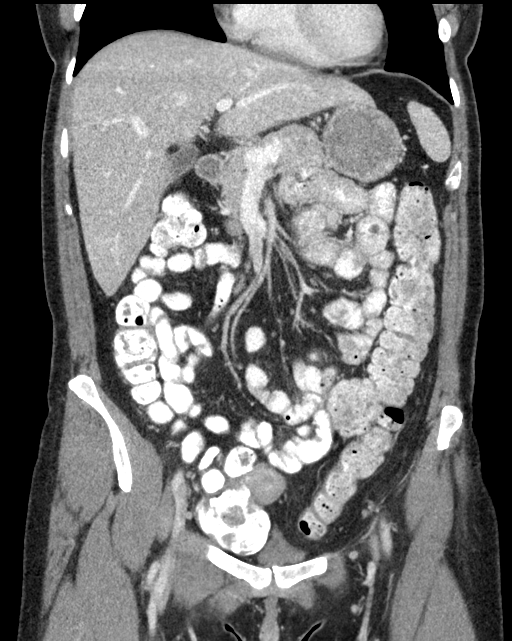
[im 53/120  soft-tissue]
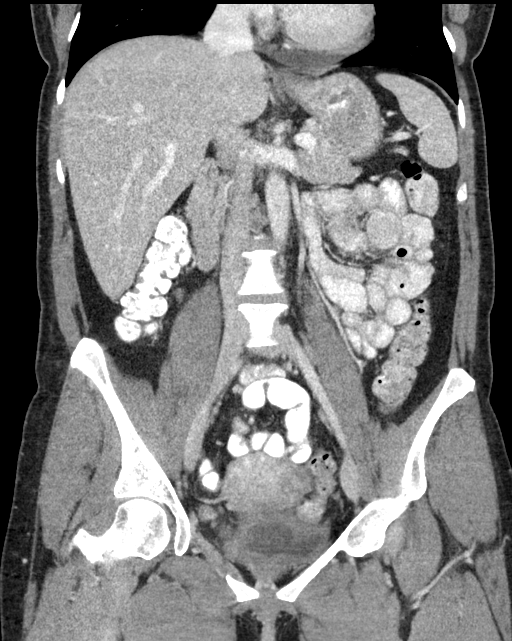
[im 67/120  soft-tissue]
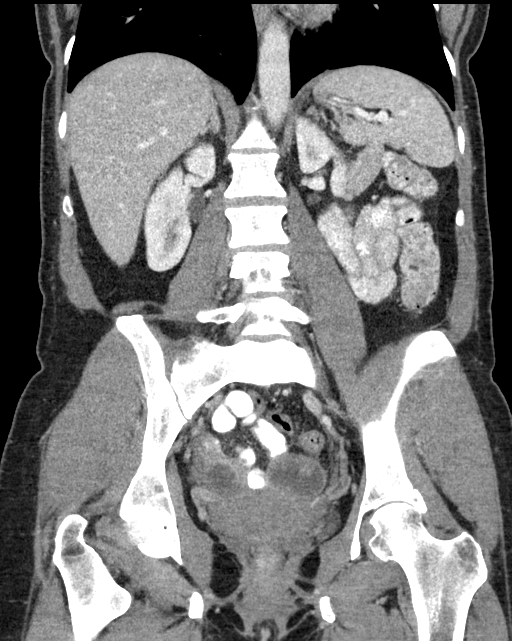

[15 of 46 positions shown; findings below may reference images not displayed]

FINDINGS: Lower chest: Bilateral bilateral breast implants incidentally noted.
Trace amount of pericardial fluid and/or thickening, unlikely to be
of any hemodynamic significance at this time.

Hepatobiliary: No suspicious cystic or solid hepatic lesions. No
intra or extrahepatic biliary ductal dilatation. Several partially
calcified gallstones are noted in the gallbladder, largest of which
measures up to 1.8 cm. Gallbladder is nearly completely contracted,
and there is no prior cholecystic fluid or surrounding inflammatory
changes to suggest an acute cholecystitis at this time.

Pancreas: No pancreatic mass. No pancreatic ductal dilatation. No
pancreatic or peripancreatic fluid or inflammatory changes.

Spleen: Unremarkable.

Adrenals/Urinary Tract: Bilateral kidneys and adrenal glands are
normal in appearance. No hydroureteronephrosis. Urinary bladder is
normal in appearance.

Stomach/Bowel: Normal appearance of the stomach. No pathologic
dilatation of small bowel or colon. The appendix is not confidently
identified and may be surgically absent. Regardless, there are no
inflammatory changes noted adjacent to the cecum to suggest the
presence of an acute appendicitis at this time.

Vascular/Lymphatic: No significant atherosclerotic disease, aneurysm
or dissection noted in the abdominal or pelvic vasculature. No
lymphadenopathy noted in the abdomen or pelvis.

Reproductive: Uterus is unremarkable in appearance. When compared to
the prior examination, the previously noted serpiginous
low-attenuation collections in the adnexal regions have decreased in
size and are more ovoid in appearance on today's study, measuring
3.4 x 2.8 x 3.0 cm on the right (axial image 66 of series 3 and
coronal image 72 of series 6) and 3.8 x 4.2 x 3.7 cm on the left
(axial image 65 of series 3 and coronal image 71 of series 6). These
are currently centrally low-attenuation with some peripheral
enhancement. Mild haziness in the fat adjacent to the adnexa regions
has significantly improved compared to the prior examination.

Other: No significant volume of ascites.  No pneumoperitoneum.

Musculoskeletal: There are no aggressive appearing lytic or blastic
lesions noted in the visualized portions of the skeleton.
IMPRESSION: 1. Significant regression of low-attenuation areas in the adnexal
regions bilaterally, compatible with positive response to therapy
for presumed tubo-ovarian abscesses. Residual low-attenuation
collections noted on today's examination may represent persistent
dilated portions of the tubes, or could simply represent ovarian
cysts.
2. Cholelithiasis without evidence of acute cholecystitis at this
time.
3. Additional incidental findings, as above.

## 2023-11-01 ENCOUNTER — Other Ambulatory Visit (HOSPITAL_BASED_OUTPATIENT_CLINIC_OR_DEPARTMENT_OTHER): Payer: Self-pay | Admitting: Obstetrics and Gynecology

## 2023-11-01 DIAGNOSIS — E7800 Pure hypercholesterolemia, unspecified: Secondary | ICD-10-CM

## 2023-11-02 ENCOUNTER — Telehealth: Payer: Self-pay | Admitting: Physician Assistant

## 2023-11-02 NOTE — Telephone Encounter (Signed)
New patient due for first screening colonoscopy and having GI symptoms.  Please schedule appointment with me next Friday, November 22 at 11:15 AM.  Celso Amy, PA-C

## 2023-11-06 ENCOUNTER — Other Ambulatory Visit: Payer: Self-pay

## 2023-11-09 NOTE — Progress Notes (Signed)
Celso Amy, PA-C [address]  [address]  [place], [place] [address]  Main: [phone]  Fax: [phone]   Gastroenterology Consultation  Referring Provider:     No ref. provider found Primary Care Physician:  Patient, [name] Primary Gastroenterologist:  Celso Amy, PA-C / Dr. Wyline Mood   Reason for Consultation:     Epigastric Pain, Colon Cancer Screening        HPI:   [name] [name] is a 52 y.o. y/o female referred for consultation & management  by Patient, [name].    She has been having episodes of epigastric pain for 3 or 4 years.  The pain has been acute, sudden onset, sharp, squeezing.  The pain radiates across her entire upper abdomen to the right upper quadrant and left upper quadrant.  Feels like a squeezing sensation all like a band around her entire upper abdomen.  She had 1 episode of vomiting.  She feels bloated during these episodes.  Episodes were occurring once or twice per year.  Recently She has had 3 episodes in the past 2 weeks.  Episodes of epigastric pain have become more frequent.  She has been taking OTC famotidine and antacids which helps some.  Pain can be worse after eating.  The pain has also woken her in the middle of the night.  No abdominal pain today.  She is age 52 and is due for her for screening colonoscopy.  Never had a colonoscopy.  No family history of colon cancer.  Denies rectal bleeding or unintentional weight loss.  She has some occasional episode of constipation.    08/25/2023 labs showed normal CMP.  Normal LFTs.  Last abdominal pelvic CT 06/2019 showed 3 gallstones, measuring up to 1.8 cm.  No evidence of cholecystitis or biliary dilatation.  Normal liver.  Prominent stool in the colon consistent with constipation.  Bilateral hydrosalpinx which was managed by OB/GYN.  Past Medical History:  Diagnosis Date   Gallstones    Gilbert syndrome    Ovarian cyst    Palpitations    wore heart monitor - Normal w/PVCs, no current  problems    Past Surgical History:  Procedure Laterality Date   ABDOMINOPLASTY  2010   BREAST ENHANCEMENT SURGERY  2010   CESAREAN SECTION     x 2   DILITATION & CURRETTAGE/HYSTROSCOPY WITH NOVASURE ABLATION N/A 09/27/2018   Procedure: DILATATION & CURETTAGE/HYSTEROSCOPY WITH NOVASURE ABLATION;  Surgeon: Olivia Mackie, MD;  Location: [facility];  Service: Gynecology;  Laterality: N/A;   EYE SURGERY Bilateral    bilateral   WISDOM TOOTH EXTRACTION      Prior to Admission medications   Medication Sig Start Date End Date Taking? Authorizing Provider  estradiol (ESTRACE) 0.5 MG tablet Take 0.5 mg by mouth daily. 09/07/23   [provider]  naproxen sodium (ALEVE) 220 MG tablet Take 220 mg by mouth 2 (two) times daily as needed (pain/headache).    [provider]    Family History  Problem Relation Age of Onset   Kidney cancer Mother    Prostate cancer Father      Social History   Tobacco Use   Smoking status: Never   Smokeless tobacco: Never  Vaping Use   Vaping status: Never Used  Substance Use Topics   Alcohol use: Yes    Comment: occasional   Drug use: Never    Allergies as of 11/10/2023   (No Known Allergies)    Review  of Systems:    All systems reviewed and negative except where noted in HPI.   Physical Exam:  BP 121/75   Pulse 69   Temp 98.4 F (36.9 C)   Ht 5\' 6"  (1.676 m)   Wt 148 lb 3.2 oz (67.2 kg)   BMI 23.92 kg/m  No LMP recorded. Patient has had an ablation.  General:   Alert,  Well-developed, well-nourished, pleasant and cooperative in NAD Lungs:  Respirations even and unlabored.  Clear throughout to auscultation.   No wheezes, crackles, or rhonchi. No acute distress. Heart:  Regular rate and rhythm; no murmurs, clicks, rubs, or gallops. Abdomen:  Normal bowel sounds.  No bruits.  Soft, and non-distended without masses, hepatosplenomegaly or hernias noted.  No Tenderness.  No guarding or rebound tenderness.    Neurologic:  Alert  and oriented x3;  grossly normal neurologically. Psych:  Alert and cooperative. Normal mood and affect.  Imaging Studies: No results found.  Assessment and Plan:   [name] [name] is a 52 y.o. y/o female has been referred for:   Epigastric Pain: Evaluate for Gastritis, H. Pylori and Peptic Ulcer Scheduling EGD I discussed risks of EGD with patient to include risk of bleeding, perforation, and risk of sedation.  Patient expressed understanding and agrees to proceed with EGD.   2.   Cholelithiasis  If becomes symptomatic, then surgical referral.  Consider RUQ [place] and Labs (CBC, CMP, Lipase) if she has recurrent severe upper abdominal pain.  There is no pain or tenderness on exam today.  3.   Constipation  Start OTC Miralax, Mix 1 capful in a drink once daily every day.  Drink 64 ounces of fluids daily.  4.   Colon Cancer Screening  Scheduling Colonoscopy I discussed risks of colonoscopy with patient to include risk of bleeding, colon perforation, and risk of sedation.  Patient expressed understanding and agrees to proceed with colonoscopy.   Follow up 4 weeks after EGD / Colon Procedures.  Celso Amy, PA-C

## 2023-11-10 ENCOUNTER — Ambulatory Visit (INDEPENDENT_AMBULATORY_CARE_PROVIDER_SITE_OTHER): Payer: BC Managed Care – PPO | Admitting: Physician Assistant

## 2023-11-10 ENCOUNTER — Encounter: Payer: Self-pay | Admitting: Physician Assistant

## 2023-11-10 VITALS — BP 121/75 | HR 69 | Temp 98.4°F | Ht 66.0 in | Wt 148.2 lb

## 2023-11-10 DIAGNOSIS — K5900 Constipation, unspecified: Secondary | ICD-10-CM | POA: Diagnosis not present

## 2023-11-10 DIAGNOSIS — R1013 Epigastric pain: Secondary | ICD-10-CM | POA: Diagnosis not present

## 2023-11-10 DIAGNOSIS — Z1211 Encounter for screening for malignant neoplasm of colon: Secondary | ICD-10-CM

## 2023-11-10 DIAGNOSIS — K8020 Calculus of gallbladder without cholecystitis without obstruction: Secondary | ICD-10-CM | POA: Diagnosis not present

## 2023-11-10 MED ORDER — SUTAB 1479-225-188 MG PO TABS: ORAL_TABLET | ORAL | 0 refills | Status: AC

## 2023-11-10 NOTE — Patient Instructions (Signed)
Follow up 1 month after procedures.

## 2023-11-24 ENCOUNTER — Ambulatory Visit (HOSPITAL_BASED_OUTPATIENT_CLINIC_OR_DEPARTMENT_OTHER)
Admission: RE | Admit: 2023-11-24 | Discharge: 2023-11-24 | Disposition: A | Discharge status: Home / Self Care | Payer: BC Managed Care – PPO | Source: Ambulatory Visit | Attending: Obstetrics and Gynecology | Admitting: Obstetrics and Gynecology

## 2023-11-24 DIAGNOSIS — E7800 Pure hypercholesterolemia, unspecified: Secondary | ICD-10-CM | POA: Insufficient documentation

## 2024-01-12 ENCOUNTER — Ambulatory Visit: Payer: BC Managed Care – PPO | Admitting: Certified Registered"

## 2024-01-12 ENCOUNTER — Other Ambulatory Visit: Payer: Self-pay

## 2024-01-12 ENCOUNTER — Encounter
Admission: RE | Disposition: A | Discharge status: Home / Self Care | Payer: Self-pay | Source: Home / Self Care | Attending: Gastroenterology

## 2024-01-12 ENCOUNTER — Encounter: Payer: Self-pay | Admitting: Gastroenterology

## 2024-01-12 ENCOUNTER — Ambulatory Visit
Admission: RE | Admit: 2024-01-12 | Discharge: 2024-01-12 | Disposition: A | Discharge status: Home / Self Care | Payer: BC Managed Care – PPO | Attending: Gastroenterology | Admitting: Gastroenterology

## 2024-01-12 DIAGNOSIS — K2970 Gastritis, unspecified, without bleeding: Secondary | ICD-10-CM | POA: Insufficient documentation

## 2024-01-12 DIAGNOSIS — Z1211 Encounter for screening for malignant neoplasm of colon: Secondary | ICD-10-CM | POA: Insufficient documentation

## 2024-01-12 DIAGNOSIS — R109 Unspecified abdominal pain: Secondary | ICD-10-CM | POA: Insufficient documentation

## 2024-01-12 DIAGNOSIS — R1013 Epigastric pain: Secondary | ICD-10-CM

## 2024-01-12 HISTORY — PX: BIOPSY: SHX5522

## 2024-01-12 HISTORY — PX: COLONOSCOPY WITH PROPOFOL: SHX5780

## 2024-01-12 HISTORY — PX: ESOPHAGOGASTRODUODENOSCOPY (EGD) WITH PROPOFOL: SHX5813

## 2024-01-12 SURGERY — ESOPHAGOGASTRODUODENOSCOPY (EGD) WITH PROPOFOL
Anesthesia: General

## 2024-01-12 MED ORDER — LIDOCAINE HCL (CARDIAC) PF 100 MG/5ML IV SOSY
PREFILLED_SYRINGE | INTRAVENOUS | Status: DC | PRN
  Administered 2024-01-12: 100 mg via INTRAVENOUS

## 2024-01-12 MED ORDER — PROPOFOL 1000 MG/100ML IV EMUL
INTRAVENOUS | Status: AC
  Filled 2024-01-12: qty 100

## 2024-01-12 MED ORDER — PROPOFOL 10 MG/ML IV BOLUS
INTRAVENOUS | Status: DC | PRN
  Administered 2024-01-12: 100 mg via INTRAVENOUS

## 2024-01-12 MED ORDER — SODIUM CHLORIDE 0.9 % IV SOLN: INTRAVENOUS | Status: DC

## 2024-01-12 MED ORDER — PROPOFOL 500 MG/50ML IV EMUL
INTRAVENOUS | Status: DC | PRN
  Administered 2024-01-12: 150 ug/kg/min via INTRAVENOUS

## 2024-01-12 NOTE — H&P (Signed)
Wyline Mood, MD [address], [address], [place], [place], [address] [address], [address], [place], [place], [address] Phone: [phone]  Fax: [phone]  Primary Care Physician:  Pcp, No   Pre-Procedure History & Physical: HPI:  [name] [name] is a 53 y.o. female is here for an endoscopy and colonoscopy    Past Medical History:  Diagnosis Date   Gallstones    Sullivan Lone syndrome    Ovarian cyst    Palpitations    wore heart monitor - Normal w/PVCs, no current problems    Past Surgical History:  Procedure Laterality Date   ABDOMINOPLASTY  2010   BREAST ENHANCEMENT SURGERY  2010   CESAREAN SECTION     x 2   DILITATION & CURRETTAGE/HYSTROSCOPY WITH NOVASURE ABLATION N/A 09/27/2018   Procedure: DILATATION & CURETTAGE/HYSTEROSCOPY WITH NOVASURE ABLATION;  Surgeon: Olivia Mackie, MD;  Location: [facility];  Service: Gynecology;  Laterality: N/A;   EYE SURGERY Bilateral    bilateral   WISDOM TOOTH EXTRACTION      Prior to Admission medications   Medication Sig Start Date End Date Taking? Authorizing Provider  Estradiol-Progesterone (BIJUVA PO) Take 1 capsule by mouth daily.   Yes [provider]  estradiol (ESTRACE) 0.5 MG tablet Take 0.5 mg by mouth daily. 09/07/23   [provider]    Allergies as of 11/10/2023   (No Known Allergies)    Family History  Problem Relation Age of Onset   Kidney cancer Mother    Prostate cancer Father     Social History   Socioeconomic History   Marital status: Married    Spouse name: Chrissie Noa   Number of children: 2   Years of education: Not on file   Highest education level: Not on file  Occupational History   Not on file  Tobacco Use   Smoking status: Never   Smokeless tobacco: Never  Vaping Use   Vaping status: Never Used  Substance and Sexual Activity   Alcohol use: Yes    Comment: occasional   Drug use: Never   Sexual activity: Not on file    Comment: Husband vasectomy  Other Topics Concern    Not on file  Social History Narrative   Not on file   Social Drivers of Health   Financial Resource Strain: Not on file  Food Insecurity: Not on file  Transportation Needs: Not on file  Physical Activity: Not on file  Stress: Not on file  Social Connections: Not on file  Intimate Partner Violence: Not on file    Review of Systems: See HPI, otherwise negative ROS  Physical Exam: BP 124/81   Pulse 69   Temp (!) 96.6 F (35.9 C) (Temporal)   Resp 16   Wt 67.6 kg   SpO2 100%   BMI 24.05 kg/m  General:   Alert,  pleasant and cooperative in NAD Head:  Normocephalic and atraumatic. Neck:  Supple; no masses or thyromegaly. Lungs:  Clear throughout to auscultation, normal respiratory effort.    Heart:  +S1, +S2, Regular rate and rhythm, No edema. Abdomen:  Soft, nontender and nondistended. Normal bowel sounds, without guarding, and without rebound.   Neurologic:  Alert and  oriented x4;  grossly normal neurologically.  Impression/Plan: [name] [name] is here for an endoscopy and colonoscopy  to be performed for  evaluation of abdominal pain and colon cancer screening     Risks, benefits, limitations, and alternatives regarding endoscopy have been reviewed with the  patient.  Questions have been answered.  All parties agreeable.   Wyline Mood, MD  01/12/2024, 7:46 AM

## 2024-01-12 NOTE — Transfer of Care (Signed)
Immediate Anesthesia Transfer of Care Note  Patient: [name] [name]  Procedure(s) Performed: ESOPHAGOGASTRODUODENOSCOPY (EGD) WITH PROPOFOL COLONOSCOPY WITH PROPOFOL BIOPSY  Patient Location: PACU  Anesthesia Type:General  Level of Consciousness: awake  Airway & Oxygen Therapy: Patient Spontanous Breathing  Post-op Assessment: Report given to RN and Post -op Vital signs reviewed and stable  Post vital signs: Reviewed and stable  Last Vitals:  Vitals Value Taken Time  BP 136/86 01/12/24 0816  Temp    Pulse 71 01/12/24 0817  Resp 21 01/12/24 0817  SpO2 100 % 01/12/24 0817  Vitals shown include unfiled device data.  Last Pain:  Vitals:   01/12/24 0816  TempSrc:   PainSc: Asleep         Complications: No notable events documented.

## 2024-01-12 NOTE — Anesthesia Procedure Notes (Signed)
Procedure Name: MAC Date/Time: 01/12/2024 7:56 AM  Performed by: Cheral Bay, CRNAPre-anesthesia Checklist: Patient identified, Emergency Drugs available, Suction available, Patient being monitored and Timeout performed Patient Re-evaluated:Patient Re-evaluated prior to induction Oxygen Delivery Method: Nasal cannula Induction Type: IV induction Placement Confirmation: positive ETCO2 and CO2 detector

## 2024-01-12 NOTE — Op Note (Signed)
[facility] Gastroenterology Patient Name: [name] [name] Procedure Date: 01/12/2024 7:51 AM MRN: [id number] Account #: [id number] Date of Birth: February 15, 1971 Admit Type: Outpatient Age: 53 Room: [facility] ENDO ROOM 2 Gender: Female Note Status: Finalized Instrument Name: Upper Endoscope [id number] Procedure:             Upper GI endoscopy Indications:           Abdominal pain Providers:             Wyline Mood MD, MD Referring MD:          No Local Md, MD (Referring MD) Medicines:             Monitored Anesthesia Care Complications:         No immediate complications. Procedure:             Pre-Anesthesia Assessment:                        - Prior to the procedure, a History and Physical was                         performed, and patient medications, allergies and                         sensitivities were reviewed. The patient's tolerance                         of previous anesthesia was reviewed.                        - The risks and benefits of the procedure and the                         sedation options and risks were discussed with the                         patient. All questions were answered and informed                         consent was obtained.                        - ASA Grade Assessment: II - A patient with mild                         systemic disease.                        After obtaining informed consent, the endoscope was                         passed under direct vision. Throughout the procedure,                         the patient's blood pressure, pulse, and oxygen                         saturations were monitored continuously. The Endoscope                         was  introduced through the mouth, and advanced to the                         third part of duodenum. The upper GI endoscopy was                         accomplished with ease. The patient tolerated the                         procedure well. Findings:      The examined duodenum  was normal.      The esophagus was normal.      Diffuse moderate inflammation characterized by congestion (edema) and       erythema was found on the greater curvature of the gastric antrum.       Biopsies were taken with a cold forceps for histology.      The cardia and gastric fundus were normal on retroflexion. Impression:            - Normal examined duodenum.                        - Normal esophagus.                        - Gastritis. Biopsied. Recommendation:        - Await pathology results.                        - Perform a colonoscopy today. Procedure Code(s):     --- Professional ---                        [phone], Esophagogastroduodenoscopy, flexible,                         transoral; with biopsy, single or multiple Diagnosis Code(s):     --- Professional ---                        K29.70, Gastritis, unspecified, without bleeding                        R10.9, Unspecified abdominal pain CPT copyright 2022 American Medical Association. All rights reserved. The codes documented in this report are preliminary and upon coder review may  be revised to meet current compliance requirements. Wyline Mood, MD Wyline Mood MD, MD 01/12/2024 7:59:36 AM This report has been signed electronically. Number of Addenda: 0 Note Initiated On: 01/12/2024 7:51 AM Estimated Blood Loss:  Estimated blood loss: none.      [facility]

## 2024-01-12 NOTE — Anesthesia Postprocedure Evaluation (Signed)
Anesthesia Post Note  Patient: [name] [name]  Procedure(s) Performed: ESOPHAGOGASTRODUODENOSCOPY (EGD) WITH PROPOFOL COLONOSCOPY WITH PROPOFOL BIOPSY  Patient location during evaluation: PACU Anesthesia Type: General Level of consciousness: awake and awake and alert Pain management: satisfactory to patient Vital Signs Assessment: post-procedure vital signs reviewed and stable Respiratory status: spontaneous breathing and nonlabored ventilation Cardiovascular status: blood pressure returned to baseline Anesthetic complications: no   No notable events documented.   Last Vitals:  Vitals:   01/12/24 0816 01/12/24 0826  BP: 136/86 (!) 116/98  Pulse: 81 69  Resp: 16 13  Temp:    SpO2: 100% 100%    Last Pain:  Vitals:   01/12/24 0826  TempSrc:   PainSc: 0-No pain                 VAN STAVEREN,Millette Halberstam

## 2024-01-12 NOTE — Op Note (Signed)
[facility] Gastroenterology Patient Name: [name] [name] Procedure Date: 01/12/2024 7:50 AM MRN: [id number] Account #: [id number] Date of Birth: Apr 26, 1971 Admit Type: Outpatient Age: 53 Room: [facility] ENDO ROOM 2 Gender: Female Note Status: Finalized Instrument Name: Prentice Docker [id number] Procedure:             Colonoscopy Indications:           Screening for colorectal malignant neoplasm Providers:             Wyline Mood MD, MD Referring MD:          No Local Md, MD (Referring MD) Medicines:             Monitored Anesthesia Care Complications:         No immediate complications. Procedure:             Pre-Anesthesia Assessment:                        - Prior to the procedure, a History and Physical was                         performed, and patient medications, allergies and                         sensitivities were reviewed. The patient's tolerance                         of previous anesthesia was reviewed.                        - The risks and benefits of the procedure and the                         sedation options and risks were discussed with the                         patient. All questions were answered and informed                         consent was obtained.                        - ASA Grade Assessment: II - A patient with mild                         systemic disease.                        After obtaining informed consent, the colonoscope was                         passed under direct vision. Throughout the procedure,                         the patient's blood pressure, pulse, and oxygen                         saturations were monitored continuously. The                         Colonoscope was  introduced through the anus and                         advanced to the the cecum, identified by the                         appendiceal orifice. The colonoscopy was performed                         with ease. The patient tolerated the procedure well.                          The quality of the bowel preparation was excellent.                         The ileocecal valve, appendiceal orifice, and rectum                         were photographed. Findings:      The perianal and digital rectal examinations were normal.      The entire examined colon appeared normal on direct and retroflexion       views. Impression:            - The entire examined colon is normal on direct and                         retroflexion views.                        - No specimens collected. Recommendation:        - Discharge patient to home (with escort).                        - Resume previous diet.                        - Continue present medications.                        - Repeat colonoscopy in 10 years for screening                         purposes. Procedure Code(s):     --- Professional ---                        [phone], Colonoscopy, flexible; diagnostic, including                         collection of specimen(s) by brushing or washing, when                         performed (separate procedure) Diagnosis Code(s):     --- Professional ---                        Z12.11, Encounter for screening for malignant neoplasm                         of colon CPT copyright 2022 American Medical Association. All rights reserved. The codes documented in this  report are preliminary and upon coder review may  be revised to meet current compliance requirements. Wyline Mood, MD Wyline Mood MD, MD 01/12/2024 8:15:01 AM This report has been signed electronically. Number of Addenda: 0 Note Initiated On: 01/12/2024 7:50 AM Scope Withdrawal Time: 0 hours 7 minutes 12 seconds  Total Procedure Duration: 0 hours 11 minutes 51 seconds  Estimated Blood Loss:  Estimated blood loss: none.      [facility]

## 2024-01-12 NOTE — Anesthesia Preprocedure Evaluation (Signed)
Anesthesia Evaluation  Patient identified by MRN, date of birth, ID band Patient awake    Reviewed: Allergy & Precautions, NPO status , Patient's Chart, lab work & pertinent test results  Airway Mallampati: II  TM Distance: >3 FB     Dental  (+) Teeth Intact   Pulmonary neg pulmonary ROS   Pulmonary exam normal        Cardiovascular Exercise Tolerance: Good + dysrhythmias  Rhythm:Regular     Neuro/Psych negative neurological ROS  negative psych ROS   GI/Hepatic negative GI ROS, Neg liver ROS,,,  Endo/Other  negative endocrine ROS    Renal/GU negative Renal ROS     Musculoskeletal   Abdominal Normal abdominal exam  (+)   Peds negative pediatric ROS (+)  Hematology negative hematology ROS (+)   Anesthesia Other Findings   Reproductive/Obstetrics                             Anesthesia Physical Anesthesia Plan  ASA: 2  Anesthesia Plan: General   Post-op Pain Management:    Induction: Intravenous  PONV Risk Score and Plan:   Airway Management Planned: Natural Airway and Nasal Cannula  Additional Equipment:   Intra-op Plan:   Post-operative Plan: Extubation in OR  Informed Consent: I have reviewed the patients History and Physical, chart, labs and discussed the procedure including the risks, benefits and alternatives for the proposed anesthesia with the patient or authorized representative who has indicated his/her understanding and acceptance.     Dental Advisory Given  Plan Discussed with: Anesthesiologist, CRNA and Surgeon  Anesthesia Plan Comments: (Patient consented for risks of anesthesia including but not limited to:  - adverse reactions to medications - damage to eyes, teeth, lips or other oral mucosa - nerve damage due to positioning  - sore throat or hoarseness - Damage to heart, brain, nerves, lungs, other parts of body or loss of life  Patient voiced  understanding and assent.)       Anesthesia Quick Evaluation

## 2024-01-15 ENCOUNTER — Encounter: Payer: Self-pay | Admitting: Gastroenterology

## 2024-01-15 LAB — SURGICAL PATHOLOGY

## 2024-01-16 ENCOUNTER — Encounter: Payer: Self-pay | Admitting: Gastroenterology

## 2024-01-19 ENCOUNTER — Other Ambulatory Visit: Payer: Self-pay

## 2024-01-19 ENCOUNTER — Telehealth: Payer: Self-pay

## 2024-01-19 ENCOUNTER — Telehealth: Payer: Self-pay | Admitting: Physician Assistant

## 2024-01-19 DIAGNOSIS — R1011 Right upper quadrant pain: Secondary | ICD-10-CM

## 2024-01-19 NOTE — Telephone Encounter (Signed)
Needs RUQ [place] and Labs.  Dx: RUQ Pain.  Evaluate for Gallstones.

## 2024-01-19 NOTE — Telephone Encounter (Signed)
Left detailed message.  Ultrasound scheduled @ [address] [place]- 01-26-24 arrive at 8 am. Nothing to eat or drink after midnight.

## 2024-01-26 ENCOUNTER — Other Ambulatory Visit: Payer: BC Managed Care – PPO

## 2024-02-02 ENCOUNTER — Other Ambulatory Visit: Payer: BC Managed Care – PPO

## 2024-02-03 LAB — CBC WITH DIFF/PLATELET
Basophils Absolute: 0 10*3/uL (ref 0.0–0.2)
Basos: 1 %
EOS (ABSOLUTE): 0.1 10*3/uL (ref 0.0–0.4)
Eos: 1 %
Hematocrit: 46.8 % — ABNORMAL HIGH (ref 34.0–46.6)
Hemoglobin: 16.3 g/dL — ABNORMAL HIGH (ref 11.1–15.9)
Immature Grans (Abs): 0 10*3/uL (ref 0.0–0.1)
Immature Granulocytes: 0 %
Lymphocytes Absolute: 2.5 10*3/uL (ref 0.7–3.1)
Lymphs: 40 %
MCH: 30.9 pg (ref 26.6–33.0)
MCHC: 34.8 g/dL (ref 31.5–35.7)
MCV: 89 fL (ref 79–97)
Monocytes Absolute: 0.3 10*3/uL (ref 0.1–0.9)
Monocytes: 5 %
Neutrophils Absolute: 3.3 10*3/uL (ref 1.4–7.0)
Neutrophils: 53 %
Platelets: 200 10*3/uL (ref 150–450)
RBC: 5.27 x10E6/uL (ref 3.77–5.28)
RDW: 11.4 % — ABNORMAL LOW (ref 11.7–15.4)
WBC: 6.2 10*3/uL (ref 3.4–10.8)

## 2024-02-03 LAB — COMPREHENSIVE METABOLIC PANEL
ALT: 21 [IU]/L (ref 0–32)
AST: 22 [IU]/L (ref 0–40)
Albumin: 5 g/dL — ABNORMAL HIGH (ref 3.8–4.9)
Alkaline Phosphatase: 64 [IU]/L (ref 44–121)
BUN/Creatinine Ratio: 11 (ref 9–23)
BUN: 11 mg/dL (ref 6–24)
Bilirubin Total: 0.5 mg/dL (ref 0.0–1.2)
CO2: 24 mmol/L (ref 20–29)
Calcium: 10.2 mg/dL (ref 8.7–10.2)
Chloride: 100 mmol/L (ref 96–106)
Creatinine, Ser: 0.98 mg/dL (ref 0.57–1.00)
Globulin, Total: 2.6 g/dL (ref 1.5–4.5)
Glucose: 95 mg/dL (ref 70–99)
Potassium: 4.3 mmol/L (ref 3.5–5.2)
Sodium: 140 mmol/L (ref 134–144)
Total Protein: 7.6 g/dL (ref 6.0–8.5)
eGFR: 70 mL/min/{1.73_m2} (ref >59)

## 2024-02-03 LAB — LIPASE: Lipase: 100 U/L — ABNORMAL HIGH (ref 14–72)

## 2024-02-05 ENCOUNTER — Encounter: Payer: Self-pay | Admitting: Physician Assistant

## 2024-02-05 ENCOUNTER — Telehealth: Payer: Self-pay

## 2024-02-05 DIAGNOSIS — R109 Unspecified abdominal pain: Secondary | ICD-10-CM

## 2024-02-05 NOTE — Telephone Encounter (Signed)
 Left message to return call to office   Call and notify patient labs show:  1.  Normal glucose, kidney test, liver test, and electrolytes.  2.  White count and hemoglobin are normal.  No evidence of anemia or infection.  3.  Lipase pancreas enzyme is mildly elevated.  Continue with plan for right upper quadrant ultrasound as scheduled to further evaluate gallstones.  **I also recommend that we schedule an abdominal CT with contrast (pancreas protocol) - Dx Elevated Lipase and Upper abdominal pain.  CT scan gives more detail of the pancreas.  Ultrasound shows gallstones better.  Celso Amy, PA-C

## 2024-02-06 ENCOUNTER — Telehealth: Payer: Self-pay

## 2024-02-06 NOTE — Telephone Encounter (Signed)
 Insurance will not cover diagnostic imaging she has to go to diagnostic imaging in [place]. Please change the location of the CT scan.           Order Request Summary Health Plan: [name] [name]  Scheduled Date of Service: 02/16/2024   Order ID: [id number]       Authorized  Approval Valid Through: 02/06/2024 - 05/05/2024 This order is not a guarantee of payment except when required by applicable law. When applicable law allows, payment is subject to the member's active enrollment, benefit limitation and other terms of the member's contract at the time of services provided. Member Information: [name] , [name] Member #: [id number] [address] [place] , [place] [id number] Date of Birth: 1971/01/09 Phone: [phone] Ordering Provider: Celso [name] [address] [place] , [place] [address] Phone: [phone] Fax: [phone] NPI: [id number] Servicing Provider:  [name] [name] [name] [address]  [place] , [place] [address] Phone: [phone] Fax: NPI: [id number] TIN:

## 2024-02-06 NOTE — Telephone Encounter (Signed)
 Per Loretta Plume sp the [place] is scheduled at [facility] 02-16-24 8 am- CT scheduled 02/16/24 [facility] [place] location at 10:40 am - I can't find the message you sent earlier on this patient- just making sure these are Prior authorized

## 2024-02-06 NOTE — Telephone Encounter (Signed)
 Left message to call office- I can schedule the Ct at [facility] location - or the patient can schedule- [phone] was provided  on patients VM.   [name] [name], please call and notify patient message below.  See if she can get her CT done in [place].  Call her cell phone.  She is not at work today.  Celso Amy, PA-C    Call and notify patient labs show: 1.  Normal glucose, kidney test, liver test, and electrolytes. 2.  White count and hemoglobin are normal.  No evidence of anemia or infection. 3.  Lipase pancreas enzyme is mildly elevated. Continue with plan for right upper quadrant ultrasound as scheduled to further evaluate gallstones.  **I also recommend that we schedule an abdominal CT with contrast (pancreas protocol) - Dx Elevated Lipase and Upper abdominal pain.  CT scan gives more detail of the pancreas.  Ultrasound shows gallstones better. Celso Amy, PA-C    1.  Normal glucose, kidney test, liver test, and electrolytes.  2.  White count and hemoglobin are normal.  No evidence of anemia or infection.  3.  Lipase pancreas enzyme is mildly elevated.  Continue with plan for right upper quadrant ultrasound as scheduled to further evaluate gallstones.  **I also recommend that we schedule an abdominal CT with contrast (pancreas protocol) - Dx Elevated Lipase and Upper abdominal pain.  CT scan gives more detail of the pancreas.  Ultrasound shows gallstones better.  Celso Amy, PA-C

## 2024-02-06 NOTE — Telephone Encounter (Signed)
 Left detailed message on patient's VM.  RUQ [place] scheduled [facility] 8 am.  CT scheduled [address] [place]  arrive at 10:40 am -liquids only 4 hours prior.

## 2024-02-09 ENCOUNTER — Ambulatory Visit: Payer: BC Managed Care – PPO | Admitting: Physician Assistant

## 2024-02-16 ENCOUNTER — Ambulatory Visit
Admission: RE | Admit: 2024-02-16 | Discharge: 2024-02-16 | Disposition: A | Discharge status: Home / Self Care | Payer: BC Managed Care – PPO | Source: Ambulatory Visit | Attending: Physician Assistant | Admitting: Physician Assistant

## 2024-02-16 ENCOUNTER — Other Ambulatory Visit: Payer: BC Managed Care – PPO

## 2024-02-16 DIAGNOSIS — R109 Unspecified abdominal pain: Secondary | ICD-10-CM

## 2024-02-16 DIAGNOSIS — R1011 Right upper quadrant pain: Secondary | ICD-10-CM

## 2024-02-16 MED ORDER — IOPAMIDOL (ISOVUE-370) INJECTION 76%
80.0000 mL | Freq: Once | INTRAVENOUS | Status: AC | PRN
  Administered 2024-02-16: 80 mL via INTRAVENOUS

## 2024-02-19 ENCOUNTER — Encounter: Payer: Self-pay | Admitting: Physician Assistant

## 2024-02-21 ENCOUNTER — Encounter: Payer: Self-pay | Admitting: Physician Assistant
